# Patient Record
Sex: Male | Born: 1955 | Race: White | Hispanic: No | Marital: Married | Smoking: Current every day smoker
Health system: Southern US, Community
[De-identification: ages and names within clinical notes are randomized; demographics above are authoritative.]

## PROBLEM LIST (undated history)

## (undated) DIAGNOSIS — R079 Chest pain, unspecified: Secondary | ICD-10-CM

## (undated) DIAGNOSIS — N400 Enlarged prostate without lower urinary tract symptoms: Secondary | ICD-10-CM

## (undated) DIAGNOSIS — E785 Hyperlipidemia, unspecified: Secondary | ICD-10-CM

## (undated) DIAGNOSIS — N433 Hydrocele, unspecified: Secondary | ICD-10-CM

## (undated) DIAGNOSIS — I10 Essential (primary) hypertension: Secondary | ICD-10-CM

## (undated) HISTORY — DX: Essential (primary) hypertension: I10

## (undated) HISTORY — DX: Hyperlipidemia, unspecified: E78.5

## (undated) HISTORY — DX: Chest pain, unspecified: R07.9

## (undated) HISTORY — DX: Hydrocele, unspecified: N43.3

## (undated) HISTORY — DX: Benign prostatic hyperplasia without lower urinary tract symptoms: N40.0

---

## 1978-01-26 HISTORY — PX: RHINOPLASTY: SUR1284

## 2000-03-26 ENCOUNTER — Ambulatory Visit (HOSPITAL_COMMUNITY): Admission: RE | Admit: 2000-03-26 | Discharge: 2000-03-26 | Payer: Self-pay | Admitting: Gastroenterology

## 2010-05-22 ENCOUNTER — Emergency Department (HOSPITAL_COMMUNITY): Payer: Worker's Compensation

## 2010-05-22 ENCOUNTER — Emergency Department (HOSPITAL_COMMUNITY)
Admission: EM | Admit: 2010-05-22 | Discharge: 2010-05-22 | Disposition: A | Discharge status: Home / Self Care | Payer: Worker's Compensation | Attending: Emergency Medicine | Admitting: Emergency Medicine

## 2010-05-22 DIAGNOSIS — E7800 Pure hypercholesterolemia, unspecified: Secondary | ICD-10-CM | POA: Insufficient documentation

## 2010-05-22 DIAGNOSIS — T675XXA Heat exhaustion, unspecified, initial encounter: Secondary | ICD-10-CM | POA: Insufficient documentation

## 2010-05-22 DIAGNOSIS — X30XXXA Exposure to excessive natural heat, initial encounter: Secondary | ICD-10-CM | POA: Insufficient documentation

## 2010-05-22 DIAGNOSIS — R05 Cough: Secondary | ICD-10-CM | POA: Insufficient documentation

## 2010-05-22 DIAGNOSIS — R059 Cough, unspecified: Secondary | ICD-10-CM | POA: Insufficient documentation

## 2010-05-22 DIAGNOSIS — R61 Generalized hyperhidrosis: Secondary | ICD-10-CM | POA: Insufficient documentation

## 2010-05-22 DIAGNOSIS — R4182 Altered mental status, unspecified: Secondary | ICD-10-CM | POA: Insufficient documentation

## 2010-05-22 LAB — CBC
HCT: 40.4 % (ref 39.0–52.0)
MCH: 32.3 pg (ref 26.0–34.0)
MCHC: 35.6 g/dL (ref 30.0–36.0)
MCV: 90.6 fL (ref 78.0–100.0)
Platelets: 427 10*3/uL — ABNORMAL HIGH (ref 150–400)
RDW: 15.5 % (ref 11.5–15.5)

## 2010-05-22 LAB — BASIC METABOLIC PANEL
BUN: 10 mg/dL (ref 6–23)
CO2: 25 mEq/L (ref 19–32)
Chloride: 108 mEq/L (ref 96–112)
GFR calc non Af Amer: >60 mL/min (ref >60)
Glucose, Bld: 97 mg/dL (ref 70–99)
Potassium: 3.9 mEq/L (ref 3.5–5.1)
Sodium: 138 mEq/L (ref 135–145)

## 2010-05-22 LAB — DIFFERENTIAL
Eosinophils Absolute: 0 10*3/uL (ref 0.0–0.7)
Eosinophils Relative: 0 % (ref 0–5)
Lymphocytes Relative: 12 % (ref 12–46)
Lymphs Abs: 1.3 10*3/uL (ref 0.7–4.0)
Monocytes Absolute: 0.8 10*3/uL (ref 0.1–1.0)

## 2010-05-22 LAB — GLUCOSE, CAPILLARY: Glucose-Capillary: 117 mg/dL — ABNORMAL HIGH (ref 70–99)

## 2014-02-26 ENCOUNTER — Telehealth: Payer: Self-pay | Admitting: *Deleted

## 2014-02-26 NOTE — Telephone Encounter (Signed)
Patient called asking what type of vitamins he can take, a multivitamin should be okay and if he needs to take any others he can discuss with Dr. Lisbeth Renshaw on Wednesday after his treatment,patient siad "ok, with taking chemotherapy I want to keep my immune sytem up since I don't each much anyway" 11:29 AM

## 2014-09-17 ENCOUNTER — Other Ambulatory Visit: Payer: Self-pay | Admitting: Internal Medicine

## 2014-09-17 DIAGNOSIS — R519 Headache, unspecified: Secondary | ICD-10-CM

## 2014-09-17 DIAGNOSIS — R51 Headache: Principal | ICD-10-CM

## 2014-09-18 ENCOUNTER — Ambulatory Visit
Admission: RE | Admit: 2014-09-18 | Discharge: 2014-09-18 | Disposition: A | Discharge status: Home / Self Care | Payer: 59 | Source: Ambulatory Visit | Attending: Internal Medicine | Admitting: Internal Medicine

## 2014-09-18 DIAGNOSIS — R51 Headache: Principal | ICD-10-CM

## 2014-09-18 DIAGNOSIS — R519 Headache, unspecified: Secondary | ICD-10-CM

## 2014-09-21 ENCOUNTER — Other Ambulatory Visit: Payer: Worker's Compensation

## 2016-02-19 DIAGNOSIS — E782 Mixed hyperlipidemia: Secondary | ICD-10-CM | POA: Diagnosis not present

## 2016-02-19 DIAGNOSIS — Z0000 Encounter for general adult medical examination without abnormal findings: Secondary | ICD-10-CM | POA: Diagnosis not present

## 2016-10-01 DIAGNOSIS — R51 Headache: Secondary | ICD-10-CM | POA: Diagnosis not present

## 2016-10-01 DIAGNOSIS — R040 Epistaxis: Secondary | ICD-10-CM | POA: Diagnosis not present

## 2016-10-14 DIAGNOSIS — J342 Deviated nasal septum: Secondary | ICD-10-CM | POA: Diagnosis not present

## 2016-10-14 DIAGNOSIS — R51 Headache: Secondary | ICD-10-CM | POA: Diagnosis not present

## 2016-10-14 DIAGNOSIS — R040 Epistaxis: Secondary | ICD-10-CM | POA: Diagnosis not present

## 2016-10-14 DIAGNOSIS — H9313 Tinnitus, bilateral: Secondary | ICD-10-CM | POA: Diagnosis not present

## 2016-10-23 DIAGNOSIS — R51 Headache: Secondary | ICD-10-CM | POA: Diagnosis not present

## 2016-10-23 DIAGNOSIS — Z0000 Encounter for general adult medical examination without abnormal findings: Secondary | ICD-10-CM | POA: Diagnosis not present

## 2016-10-23 DIAGNOSIS — R739 Hyperglycemia, unspecified: Secondary | ICD-10-CM | POA: Diagnosis not present

## 2016-10-23 DIAGNOSIS — R030 Elevated blood-pressure reading, without diagnosis of hypertension: Secondary | ICD-10-CM | POA: Diagnosis not present

## 2016-10-23 DIAGNOSIS — E782 Mixed hyperlipidemia: Secondary | ICD-10-CM | POA: Diagnosis not present

## 2016-11-13 DIAGNOSIS — J342 Deviated nasal septum: Secondary | ICD-10-CM | POA: Diagnosis not present

## 2016-11-13 DIAGNOSIS — R51 Headache: Secondary | ICD-10-CM | POA: Diagnosis not present

## 2016-11-13 DIAGNOSIS — J321 Chronic frontal sinusitis: Secondary | ICD-10-CM | POA: Diagnosis not present

## 2016-11-13 DIAGNOSIS — J3489 Other specified disorders of nose and nasal sinuses: Secondary | ICD-10-CM | POA: Diagnosis not present

## 2016-12-03 DIAGNOSIS — R030 Elevated blood-pressure reading, without diagnosis of hypertension: Secondary | ICD-10-CM | POA: Diagnosis not present

## 2017-07-13 IMAGING — MR MR HEAD W/O CM
6 of 8 series · 30 of 48 positions shown · non-contrast
Comparison: Head CT without contrast 05/22/2010

CLINICAL DATA: 59-year-old male with 6 months of Severe headache
and pain behind the right eye with no known injury. Initial
encounter.

EXAM:
MRI HEAD WITHOUT CONTRAST
TECHNIQUE: Multiplanar, multiecho pulse sequences of the brain and surrounding
structures were obtained without intravenous contrast.

[Series 3: FLAIR · sagittal · 5.0mm · 0.47mm/px · 3 of 26 slices shown (1 of 2)]
[im 1/26]
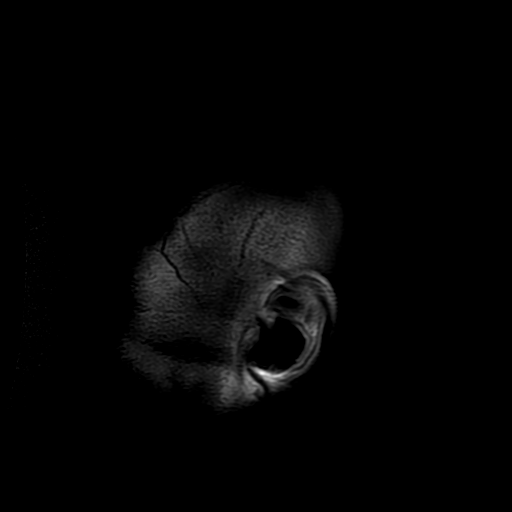
[im 13/26]
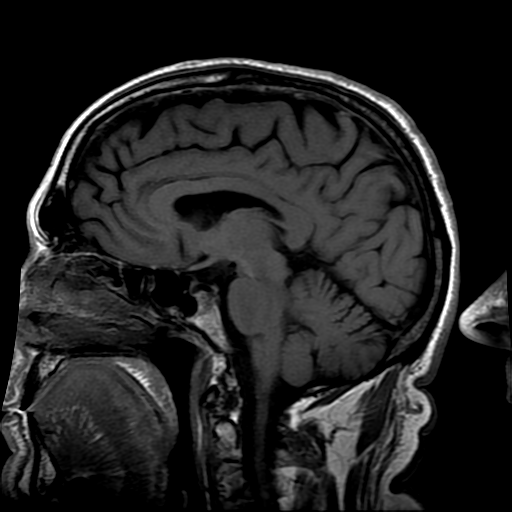
[im 26/26]
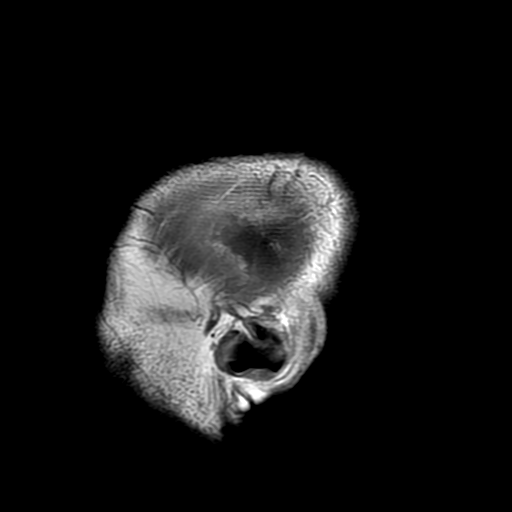

[Series 4: DWI · axial · 3.0mm · 1.09mm/px · z∈[-68,+98]mm · 9 of 114 slices shown (1 of 2)]
[im 1/114]
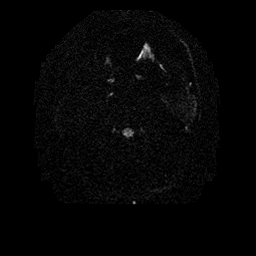
[im 17/114]
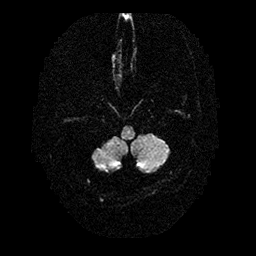
[im 33/114]
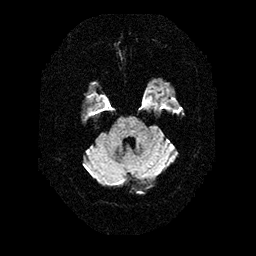
[im 49/114]
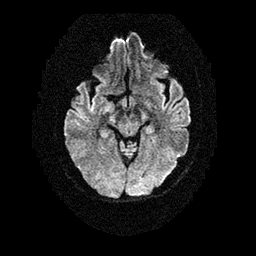
[im 57/114]
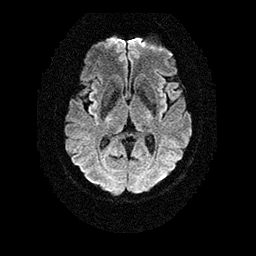
[im 65/114]
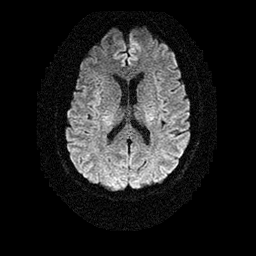
[im 81/114]
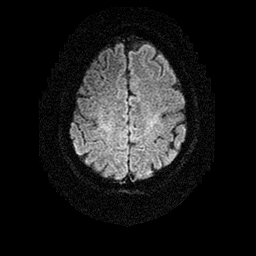
[im 97/114]
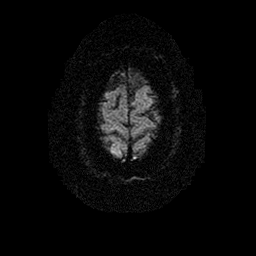
[im 114/114]
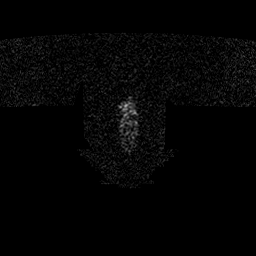

[Series 5: T2-star · axial · 5.0mm · 0.43mm/px · z∈[-57,+42]mm · 3 of 27 slices shown]
[im 1/27]
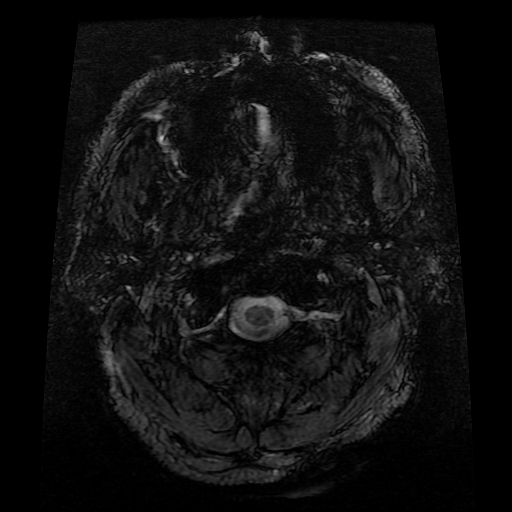
[im 9/27]
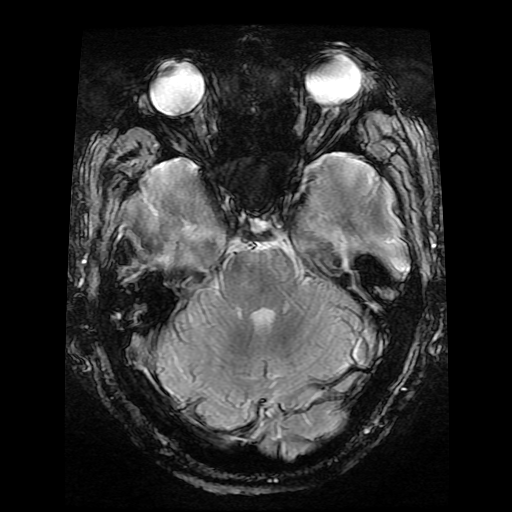
[im 18/27]
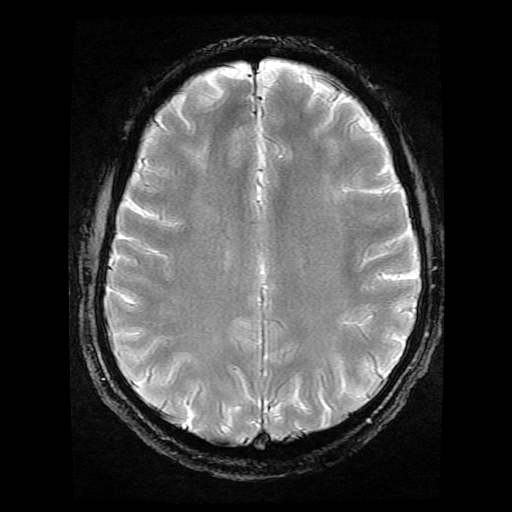

[Series 7: FLAIR · axial · 5.0mm · 0.43mm/px · z∈[-57,+95]mm · 4 of 27 slices shown (2 of 2)]
[im 1/27]
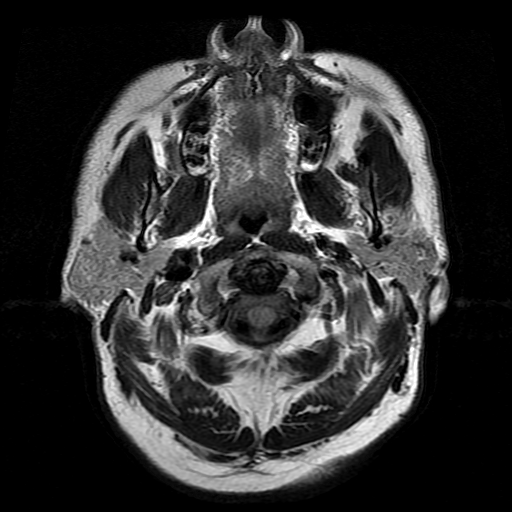
[im 9/27]
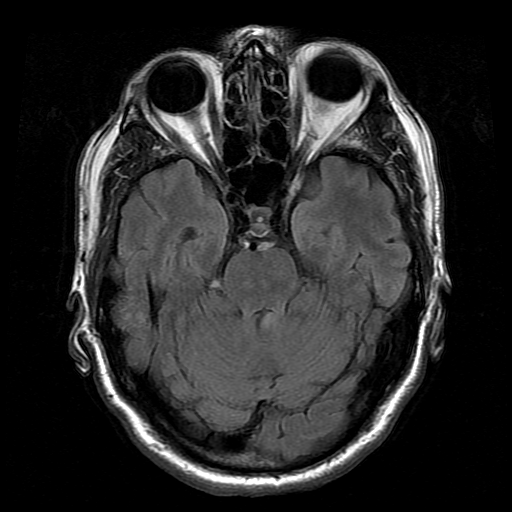
[im 18/27]
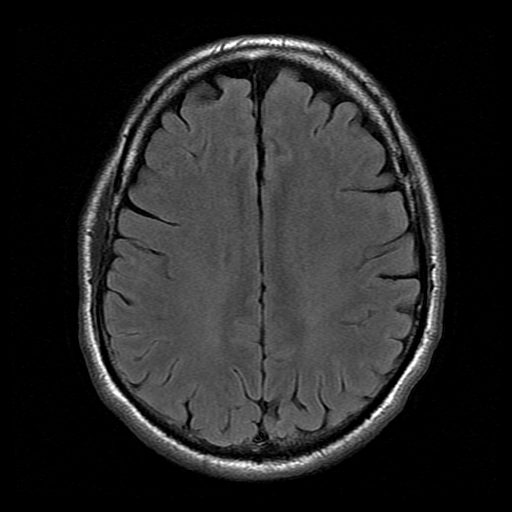
[im 27/27]
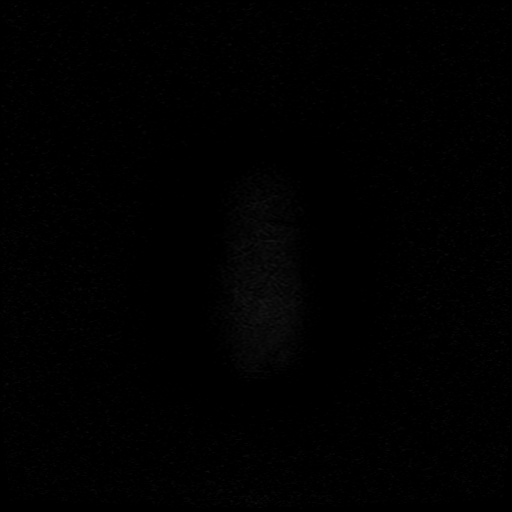

[Series 9: T2 · coronal · 5.0mm · 0.43mm/px · 4 of 31 slices shown]
[im 1/31]
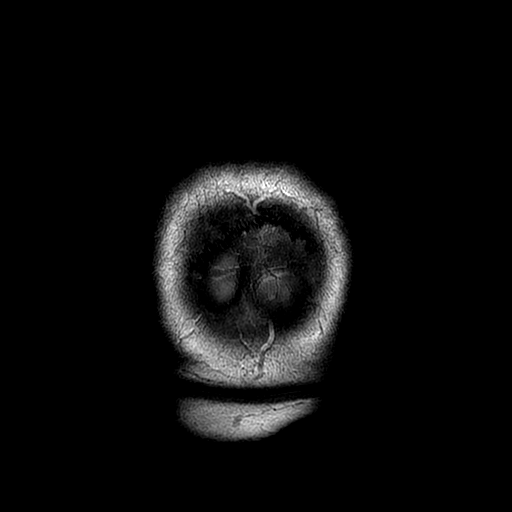
[im 11/31]
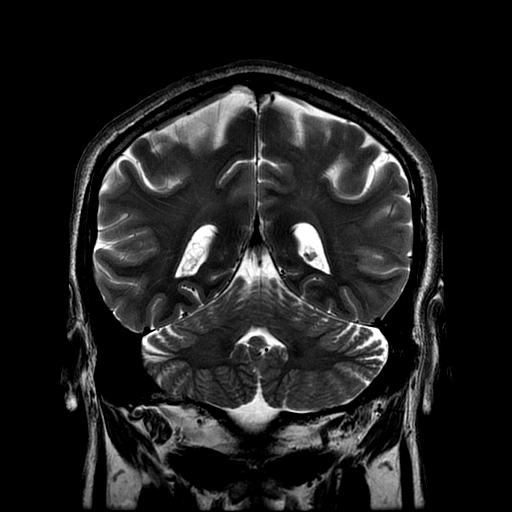
[im 21/31]
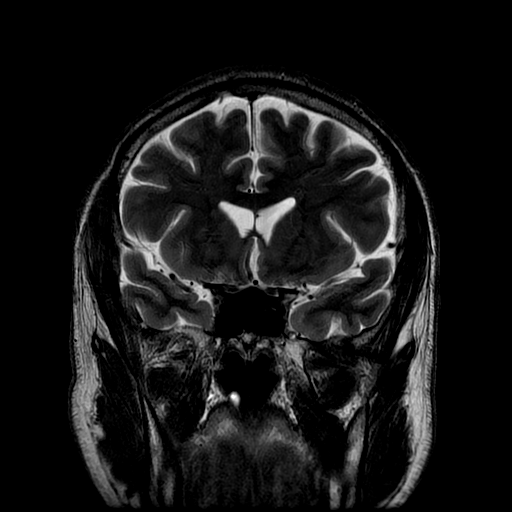
[im 31/31]
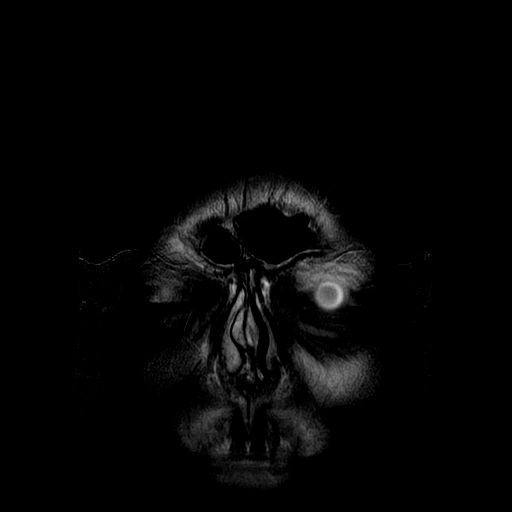

[Series 400: DWI · axial · 3.0mm · 1.09mm/px · z∈[-68,+98]mm · 7 of 57 slices shown (2 of 2)]
[im 1/57]
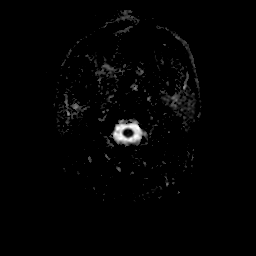
[im 10/57]
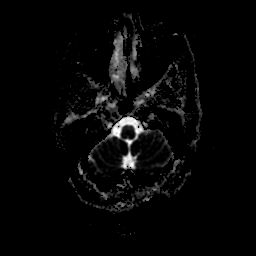
[im 19/57]
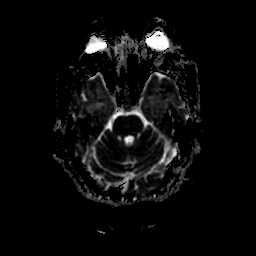
[im 29/57]
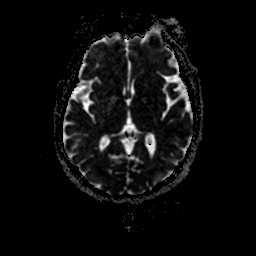
[im 38/57]
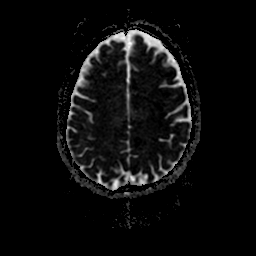
[im 47/57]
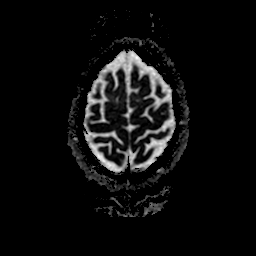
[im 57/57]
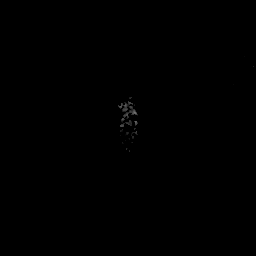

[30 of 48 positions shown; findings below may reference images not displayed]

FINDINGS: Cerebral volume remains within normal limits for age. No restricted
diffusion to suggest acute infarction. No midline shift, mass
effect, evidence of mass lesion, ventriculomegaly, extra-axial
collection or acute intracranial hemorrhage. Cervicomedullary
junction and pituitary are within normal limits. Major intracranial
vascular flow voids are within normal limits. Gray and white matter
signal is within normal limits throughout the brain. No
encephalomalacia or chronic cerebral blood products identified.

Orbits soft tissues appear normal. Paranasal sinuses and mastoids
are clear. Visible internal auditory structures appear normal.
Negative scalp soft tissues. Normal bone marrow signal. Negative
visualized cervical spine.
IMPRESSION: Normal noncontrast MRI appearance of the brain.

## 2017-11-04 DIAGNOSIS — Z0000 Encounter for general adult medical examination without abnormal findings: Secondary | ICD-10-CM | POA: Diagnosis not present

## 2017-11-04 DIAGNOSIS — E782 Mixed hyperlipidemia: Secondary | ICD-10-CM | POA: Diagnosis not present

## 2017-11-04 DIAGNOSIS — Z23 Encounter for immunization: Secondary | ICD-10-CM | POA: Diagnosis not present

## 2019-01-27 HISTORY — PX: COLONOSCOPY: SHX174

## 2021-01-26 HISTORY — PX: COLONOSCOPY: SHX174

## 2021-05-19 DIAGNOSIS — I10 Essential (primary) hypertension: Secondary | ICD-10-CM | POA: Diagnosis not present

## 2021-05-19 DIAGNOSIS — F17200 Nicotine dependence, unspecified, uncomplicated: Secondary | ICD-10-CM | POA: Diagnosis not present

## 2021-05-19 DIAGNOSIS — E782 Mixed hyperlipidemia: Secondary | ICD-10-CM | POA: Diagnosis not present

## 2021-05-27 DIAGNOSIS — D128 Benign neoplasm of rectum: Secondary | ICD-10-CM | POA: Diagnosis not present

## 2021-05-27 DIAGNOSIS — D122 Benign neoplasm of ascending colon: Secondary | ICD-10-CM | POA: Diagnosis not present

## 2021-05-27 DIAGNOSIS — D120 Benign neoplasm of cecum: Secondary | ICD-10-CM | POA: Diagnosis not present

## 2021-05-27 DIAGNOSIS — Z1211 Encounter for screening for malignant neoplasm of colon: Secondary | ICD-10-CM | POA: Diagnosis not present

## 2021-05-29 DIAGNOSIS — D122 Benign neoplasm of ascending colon: Secondary | ICD-10-CM | POA: Diagnosis not present

## 2021-12-16 ENCOUNTER — Other Ambulatory Visit: Payer: Self-pay

## 2021-12-16 ENCOUNTER — Emergency Department (HOSPITAL_COMMUNITY)
Admission: EM | Admit: 2021-12-16 | Discharge: 2021-12-17 | Discharge status: Against Medical Advice | Payer: Medicare Other | Attending: Emergency Medicine | Admitting: Emergency Medicine

## 2021-12-16 ENCOUNTER — Encounter (HOSPITAL_COMMUNITY): Payer: Self-pay

## 2021-12-16 DIAGNOSIS — Z5321 Procedure and treatment not carried out due to patient leaving prior to being seen by health care provider: Secondary | ICD-10-CM | POA: Insufficient documentation

## 2021-12-16 DIAGNOSIS — R634 Abnormal weight loss: Secondary | ICD-10-CM | POA: Diagnosis not present

## 2021-12-16 DIAGNOSIS — H547 Unspecified visual loss: Secondary | ICD-10-CM | POA: Diagnosis not present

## 2021-12-16 DIAGNOSIS — R000 Tachycardia, unspecified: Secondary | ICD-10-CM | POA: Diagnosis not present

## 2021-12-16 DIAGNOSIS — I10 Essential (primary) hypertension: Secondary | ICD-10-CM | POA: Diagnosis not present

## 2021-12-16 DIAGNOSIS — Z91141 Patient's other noncompliance with medication regimen due to financial hardship: Secondary | ICD-10-CM | POA: Diagnosis not present

## 2021-12-16 LAB — CBC
HCT: 53.2 % — ABNORMAL HIGH (ref 39.0–52.0)
Hemoglobin: 18.2 g/dL — ABNORMAL HIGH (ref 13.0–17.0)
MCH: 31.7 pg (ref 26.0–34.0)
MCHC: 34.2 g/dL (ref 30.0–36.0)
MCV: 92.5 fL (ref 80.0–100.0)
Platelets: 265 10*3/uL (ref 150–400)
RBC: 5.75 MIL/uL (ref 4.22–5.81)
RDW: 13.4 % (ref 11.5–15.5)
WBC: 12 10*3/uL — ABNORMAL HIGH (ref 4.0–10.5)
nRBC: 0 % (ref 0.0–0.2)

## 2021-12-16 LAB — COMPREHENSIVE METABOLIC PANEL
ALT: 22 U/L (ref 0–44)
AST: 25 U/L (ref 15–41)
Albumin: 5.1 g/dL — ABNORMAL HIGH (ref 3.5–5.0)
Alkaline Phosphatase: 102 U/L (ref 38–126)
Anion gap: 8 (ref 5–15)
BUN: 20 mg/dL (ref 8–23)
CO2: 26 mmol/L (ref 22–32)
Calcium: 10.5 mg/dL — ABNORMAL HIGH (ref 8.9–10.3)
Chloride: 108 mmol/L (ref 98–111)
Creatinine, Ser: 0.96 mg/dL (ref 0.61–1.24)
GFR, Estimated: >60 mL/min (ref >60)
Glucose, Bld: 110 mg/dL — ABNORMAL HIGH (ref 70–99)
Potassium: 4.7 mmol/L (ref 3.5–5.1)
Sodium: 142 mmol/L (ref 135–145)
Total Bilirubin: 1 mg/dL (ref 0.3–1.2)
Total Protein: 8.3 g/dL — ABNORMAL HIGH (ref 6.5–8.1)

## 2021-12-16 NOTE — ED Triage Notes (Signed)
Arrives EMS from PCP with c/o 180/103. Has lost 100lbs in ~6 months. Pt thinks he is being poisoned.
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
## 2021-12-18 ENCOUNTER — Emergency Department (HOSPITAL_COMMUNITY)
Admission: EM | Admit: 2021-12-18 | Discharge: 2021-12-20 | Disposition: A | Discharge status: Home / Self Care | Payer: Medicare Other | Attending: Emergency Medicine | Admitting: Emergency Medicine

## 2021-12-18 ENCOUNTER — Emergency Department (HOSPITAL_COMMUNITY): Payer: Medicare Other

## 2021-12-18 ENCOUNTER — Other Ambulatory Visit: Payer: Self-pay

## 2021-12-18 ENCOUNTER — Ambulatory Visit (HOSPITAL_COMMUNITY)
Admission: EM | Admit: 2021-12-18 | Discharge: 2021-12-18 | Disposition: A | Discharge status: Other Acute Inpatient Hospital | Payer: Medicare Other

## 2021-12-18 DIAGNOSIS — R4189 Other symptoms and signs involving cognitive functions and awareness: Secondary | ICD-10-CM | POA: Insufficient documentation

## 2021-12-18 DIAGNOSIS — R4182 Altered mental status, unspecified: Secondary | ICD-10-CM | POA: Insufficient documentation

## 2021-12-18 DIAGNOSIS — F1290 Cannabis use, unspecified, uncomplicated: Secondary | ICD-10-CM

## 2021-12-18 DIAGNOSIS — R7989 Other specified abnormal findings of blood chemistry: Secondary | ICD-10-CM | POA: Insufficient documentation

## 2021-12-18 DIAGNOSIS — I10 Essential (primary) hypertension: Secondary | ICD-10-CM | POA: Insufficient documentation

## 2021-12-18 DIAGNOSIS — F22 Delusional disorders: Secondary | ICD-10-CM

## 2021-12-18 DIAGNOSIS — R462 Strange and inexplicable behavior: Secondary | ICD-10-CM | POA: Insufficient documentation

## 2021-12-18 DIAGNOSIS — R461 Bizarre personal appearance: Secondary | ICD-10-CM | POA: Diagnosis present

## 2021-12-18 DIAGNOSIS — N179 Acute kidney failure, unspecified: Secondary | ICD-10-CM

## 2021-12-18 LAB — COMPREHENSIVE METABOLIC PANEL
ALT: 25 U/L (ref 0–44)
AST: 31 U/L (ref 15–41)
Albumin: 4.3 g/dL (ref 3.5–5.0)
Alkaline Phosphatase: 87 U/L (ref 38–126)
Anion gap: 11 (ref 5–15)
BUN: 22 mg/dL (ref 8–23)
CO2: 27 mmol/L (ref 22–32)
Calcium: 10.3 mg/dL (ref 8.9–10.3)
Chloride: 102 mmol/L (ref 98–111)
Creatinine, Ser: 1.35 mg/dL — ABNORMAL HIGH (ref 0.61–1.24)
GFR, Estimated: 58 mL/min — ABNORMAL LOW (ref >60)
Glucose, Bld: 135 mg/dL — ABNORMAL HIGH (ref 70–99)
Potassium: 3.7 mmol/L (ref 3.5–5.1)
Sodium: 140 mmol/L (ref 135–145)
Total Bilirubin: 1 mg/dL (ref 0.3–1.2)
Total Protein: 7.1 g/dL (ref 6.5–8.1)

## 2021-12-18 LAB — CBC WITH DIFFERENTIAL/PLATELET
Abs Immature Granulocytes: 0.01 10*3/uL (ref 0.00–0.07)
Basophils Absolute: 0 10*3/uL (ref 0.0–0.1)
Basophils Relative: 1 %
Eosinophils Absolute: 0 10*3/uL (ref 0.0–0.5)
Eosinophils Relative: 0 %
HCT: 50.6 % (ref 39.0–52.0)
Hemoglobin: 17.4 g/dL — ABNORMAL HIGH (ref 13.0–17.0)
Immature Granulocytes: 0 %
Lymphocytes Relative: 21 %
Lymphs Abs: 1.6 10*3/uL (ref 0.7–4.0)
MCH: 32 pg (ref 26.0–34.0)
MCHC: 34.4 g/dL (ref 30.0–36.0)
MCV: 93.2 fL (ref 80.0–100.0)
Monocytes Absolute: 0.6 10*3/uL (ref 0.1–1.0)
Monocytes Relative: 8 %
Neutro Abs: 5.6 10*3/uL (ref 1.7–7.7)
Neutrophils Relative %: 70 %
Platelets: 244 10*3/uL (ref 150–400)
RBC: 5.43 MIL/uL (ref 4.22–5.81)
RDW: 13.3 % (ref 11.5–15.5)
WBC: 7.9 10*3/uL (ref 4.0–10.5)
nRBC: 0 % (ref 0.0–0.2)

## 2021-12-18 LAB — URINALYSIS, ROUTINE W REFLEX MICROSCOPIC
Bilirubin Urine: NEGATIVE
Glucose, UA: NEGATIVE mg/dL
Ketones, ur: 20 mg/dL — AB
Leukocytes,Ua: NEGATIVE
Nitrite: NEGATIVE
Protein, ur: 30 mg/dL — AB
Specific Gravity, Urine: 1.033 — ABNORMAL HIGH (ref 1.005–1.030)
pH: 5 (ref 5.0–8.0)

## 2021-12-18 LAB — RAPID URINE DRUG SCREEN, HOSP PERFORMED
Amphetamines: NOT DETECTED
Barbiturates: NOT DETECTED
Benzodiazepines: NOT DETECTED
Cocaine: NOT DETECTED
Opiates: NOT DETECTED
Tetrahydrocannabinol: POSITIVE — AB

## 2021-12-18 LAB — ETHANOL: Alcohol, Ethyl (B): <10 mg/dL (ref <10)

## 2021-12-18 LAB — ACETAMINOPHEN LEVEL: Acetaminophen (Tylenol), Serum: <10 ug/mL — ABNORMAL LOW (ref 10–30)

## 2021-12-18 LAB — SALICYLATE LEVEL: Salicylate Lvl: <7 mg/dL — ABNORMAL LOW (ref 7.0–30.0)

## 2021-12-18 MED ORDER — LACTATED RINGERS IV BOLUS
1000.0000 mL | Freq: Once | INTRAVENOUS | Status: AC
  Administered 2021-12-18: 1000 mL via INTRAVENOUS
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
## 2021-12-18 NOTE — ED Triage Notes (Signed)
Pt is here with wife and son.  They report the pt has had auditory and visual hallucinations. Has been leaving the house barefoot and "walking the roads"  They report pt has expressed SI and HI to them.  Wife has expressed fear to be home with patient.  Pt himself denies SI/HI, any pain or hallucinations.  Pt states "this is all mind fuck to them cause they have been mind fucking me"

## 2021-12-18 NOTE — ED Notes (Signed)
TTS in process 
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
## 2021-12-18 NOTE — Progress Notes (Signed)
Paient presents to the [facility] with his son. Patient is "going through some hings and having a mental breakdown." Patient has a bizarre presentation and talking in circles to some degree. Patient states that he has not been sleeping or eating well. Patient has an attitude because his family forced him to come talk to someone. Patient appears to possibly have some early onset dementia. Thinks his son is his father. Patient denies SI/HI Psychosis. Patient admits to marijuana use. Patient is grandiose, states that, "I am a smart mother fucker." Patient is routine.

## 2021-12-18 NOTE — ED Provider Notes (Addendum)
Behavioral Health Urgent Care Medical Screening Exam  Patient Name: Jonathan Carson MRN: [id number] Date of Evaluation: 12/18/21 Chief Complaint: "flipping the fucking shit" Diagnosis:  Final diagnoses:  Disorganized thought process   History of Present illness: Jonathan Carson is a 66 y.o. male. Pt presents voluntarily to [facility] behavioral health for walk-in assessment.  Pt is accompanied by his son, Jonathan Carson. Pt is assessed face-to-face by nurse practitioner.   Jonathan Carson, 66 y.o., male patient seen face to face by this provider, patient consulted and seen with Dr. Lovette Cliche; and chart reviewed on 12/18/21.  On evaluation Jonathan Carson reports he is presenting today due to "flipping the fucking shit." Pt reports he hasn't eaten in 3 days. When asked reason for this, pt states that he is on a "journey and can't influence that". Pt reports that there is "evil" in his brother, father, son, and wife. He notes that there is also "good deep down". Pt reports he has been sleeping an hour or two a night, unable to state for how long this is occurring. He notes that despite poor sleep, he is "full of energy". He states this is likely because of "the lord's spirit and energy".   Pt denies SI/VI/HI, AVH. He is adamant that "I don't want to hurt myself or anyone else".   Pt reports history of 1 inpatient psychiatric admission at the age of 48 or 66 years old, where he was told he had a "chemical imbalance", and prescribed Mellaril. He states he has not taken any psychiatric medications since then. He denies history of suicide attempt or non suicidal self injurious behavior.   Pt endorses 30 year history of daily cigarette use. He endorses daily use of marijuana, although states he has not use in the past 2 or 3 days. Pt denies use of alcohol, crack/cocaine, or other substances.  Pt reports he owns firearms. His son, Jonathan Carson, currently is in possession of them.   Pt believes he has  prostate cancer. He states he has believed this since his [place] visit on 12/16/21 due to labwork that was completed. He states that he has "lumps" on his chest. Pt believes he has 1 year left to live.  Collateral with pt's son Jonathan Carson. Per Jonathan Carson, pt has been decompensating since February, possible earlier than that. Jonathan Carson states he does not believe pt has prostate cancer. He reports pt has had an 80 lb weight loss since February of this year. He states pt has been smoking marijuana for 30 years, stopped for 12 years, and restarted in February of this year after pt retired from work. Per Jonathan Carson, pt is isolating himself in the basement, painting the basement. Jonathan Carson states pt has kicked his wife out of the house, closed his banks accounts. Family found $20,000 on pt's table. Per Jonathan Carson, pt has at one point told him that he believes Jonathan Carson is his father. Jonathan Carson states pt has been texting his deceased friend. Pt's mother is currently on hospice care and has been diagnosed with Alzheimers. Jonathan Carson states he believe pt may be dehydrated. Jonathan Carson confirms that he has secured firearms from pt. Jonathan Carson is unaware of any psychiatric history for pt. He states pt's sister and brother have been diagnosed with bipolar disorder.   Discussed with Jonathan Carson and pt recommendation for medical clearance at the ED. Report called to Dr. Truett Mainland at [facility]. Recommend labwork for medical clearance, Head CT, CXR, and TTS consult. Pt is voluntary at this time.  Flow[facility]  C-SSRS RISK CATEGORY No Risk      Psychiatric Specialty Exam  Presentation  General Appearance:Appropriate for Environment; Casual; Fairly Groomed  Eye Contact:Fair  Speech:Clear and Coherent; Pressured  Speech Volume:Normal  Handedness:Right   Mood and Affect  Mood: Euphoric  Affect: Other (comment) (animated)   Thought Process  Thought Processes: Disorganized  Descriptions of  Associations:Tangential  Orientation:Full (Time, Place and Person)  Thought Content:Delusions; Paranoid Ideation; Tangential   Duration of Psychotic Symptoms: Greater than six months  Hallucinations:None  Ideas of Reference:Paranoia; Delusions  Suicidal Thoughts:No  Homicidal Thoughts:No   Sensorium  Memory: Immediate Good  Judgment: Poor  Insight: Shallow   Executive Functions  Concentration: Poor  Attention Span: Poor  Recall: [employer] of Knowledge: Fair  Language: Fair   Psychomotor Activity  Psychomotor Activity: Normal   Assets  Assets: Armed forces logistics/support/administrative officer; Desire for Improvement; Financial Resources/Insurance; Housing; Intimacy; Resilience; Social Support   Sleep  Sleep: Poor  Number of hours: No data recorded  No data recorded  Physical Exam: Physical Exam Constitutional:      General: He is not in acute distress.    Appearance: Normal appearance. He is not ill-appearing, toxic-appearing or diaphoretic.  Eyes:     General: No scleral icterus. Cardiovascular:     Rate and Rhythm: Normal rate.  Pulmonary:     Effort: Pulmonary effort is normal. No respiratory distress.  Neurological:     Mental Status: He is alert and oriented to person, place, and time.  Psychiatric:        Attention and Perception: Perception normal.        Mood and Affect: Mood is elated.        Speech: Speech is rapid and pressured and tangential.        Behavior: Behavior is hyperactive. Behavior is cooperative.        Thought Content: Thought content is paranoid and delusional.    Review of Systems  Constitutional:  Negative for chills and fever.  Respiratory:  Negative for shortness of breath.   Cardiovascular:  Negative for chest pain and palpitations.  Gastrointestinal:  Negative for abdominal pain.  Neurological:  Negative for headaches.  Psychiatric/Behavioral:  The patient has insomnia.    Blood pressure (!) 140/90, pulse 99, temperature  98.3 F (36.8 C), resp. rate 20, SpO2 99 %. There is no height or weight on file to calculate BMI.  Musculoskeletal: Strength & Muscle Tone: within normal limits Gait & Station: normal Patient leans: N/A   [facility] Discharge Disposition for Follow up and Recommendations: Based on my evaluation the patient is to be transferred to [facility] Emergency Department, accepting physician is Dr. Truett Mainland. Have discussed with attending physician, Dr. Lovette Cliche, and recommending labwork for medical clearance, Head CT, CXR, and TTS consult.  Tharon Aquas, NP 12/18/2021, 3:15 PM
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
## 2021-12-18 NOTE — BH Assessment (Addendum)
Clinician messaged Fuller Canada, RN: "Hey. It's Trey with TTS. Is the pt able to engage in the assessment, if so the pt will need to be placed in a private room. Is the pt under IVC? Also is the pt medically cleared? Can you fax his IVC paperwork to [phone]."   Clinician waiting response.    Vertell Novak, [place], [facility], [address] Triage Specialist [phone]

## 2021-12-18 NOTE — ED Provider Notes (Signed)
[facility] EMERGENCY DEPARTMENT Provider Note   CSN: [id number] Arrival date & time: 12/18/21  1529     History  Chief Complaint  Patient presents with   Medical Clearance    Jonathan Carson is a 66 y.o. male.  Patient is a 66 year old male with a past medical history of hypertension and hyperlipidemia not currently taking home medications presenting to the emergency department with psychosis.  Patient is here with his wife and his son who states that since February the patient has been declining.  He states that his mother has dementia and is on hospice and since her diagnosis he has had increasing psychosis.  He states that he will frequently walk up and down the streets barefoot.  They state that he stays in his basement painting for prolonged periods of time.  They state that he often checks one of his dad friends on his phone.  They state that he has tried to make moves on his daughter-in-law which is out of character for him.  They state that he also took all of his money out of his bank account and had it lying out on a table at home.  His son states that he has told him that he "he is too much of a coward" to commit suicide.  His wife states that she feels unsafe living with him at home and that he kicked her out of the house today.  They state that they were initially seen at [facility] today and were transferred here for medical clearance.  The patient reports to me that he is here today because he is concerned that his wife is poisoning him.  He states that he "needs to be scanned".  He states that she also poisoned his dog who is now deceased and buried in their backyard under a large rock.  He states that "we can do go up the dog and test him to prove that we have been poisoned as well".  He denies any pain, nausea or vomiting and denies any physical complaints to me.  The history is provided by the patient, the spouse and a relative. The history is limited by the  condition of the patient (Psychosis).       Home Medications Prior to Admission medications   Not on File      Allergies    Patient has no known allergies.    Review of Systems   Review of Systems  Physical Exam Updated Vital Signs BP (!) 153/97   Pulse 63   Temp 98.1 F (36.7 C)   Resp 18   SpO2 100%  Physical Exam Vitals and nursing note reviewed.  Constitutional:      General: He is not in acute distress.    Appearance: Normal appearance.  HENT:     Head: Normocephalic and atraumatic.     Nose: Nose normal.     Mouth/Throat:     Mouth: Mucous membranes are moist.     Pharynx: Oropharynx is clear.  Eyes:     Extraocular Movements: Extraocular movements intact.     Conjunctiva/sclera: Conjunctivae normal.     Pupils: Pupils are equal, round, and reactive to light.  Cardiovascular:     Rate and Rhythm: Normal rate and regular rhythm.     Pulses: Normal pulses.     Heart sounds: Normal heart sounds.  Pulmonary:     Effort: Pulmonary effort is normal.     Breath sounds: Normal breath sounds.  Abdominal:     General: Abdomen is flat.     Palpations: Abdomen is soft.     Tenderness: There is no abdominal tenderness.  Musculoskeletal:        General: Normal range of motion.     Cervical back: Normal range of motion and neck supple.     Right lower leg: No edema.     Left lower leg: No edema.  Skin:    General: Skin is warm and dry.  Neurological:     General: No focal deficit present.     Mental Status: He is alert and oriented to person, place, and time.     Sensory: No sensory deficit.     Motor: No weakness.  Psychiatric:     Comments: Calm and cooperative Paranoid behavior     ED Results / Procedures / Treatments   Labs (all labs ordered are listed, but only abnormal results are displayed) Labs Reviewed  COMPREHENSIVE METABOLIC PANEL - Abnormal; Notable for the following components:      Result Value   Glucose, Bld 135 (*)    Creatinine, Ser  1.35 (*)    GFR, Estimated 58 (*)    All other components within normal limits  RAPID URINE DRUG SCREEN, HOSP PERFORMED - Abnormal; Notable for the following components:   Tetrahydrocannabinol POSITIVE (*)    All other components within normal limits  CBC WITH DIFFERENTIAL/PLATELET - Abnormal; Notable for the following components:   Hemoglobin 17.4 (*)    All other components within normal limits  SALICYLATE LEVEL - Abnormal; Notable for the following components:   Salicylate Lvl <3.7 (*)    All other components within normal limits  ACETAMINOPHEN LEVEL - Abnormal; Notable for the following components:   Acetaminophen (Tylenol), Serum <10 (*)    All other components within normal limits  URINALYSIS, ROUTINE W REFLEX MICROSCOPIC - Abnormal; Notable for the following components:   APPearance HAZY (*)    Specific Gravity, Urine 1.033 (*)    Hgb urine dipstick SMALL (*)    Ketones, ur 20 (*)    Protein, ur 30 (*)    Bacteria, UA FEW (*)    All other components within normal limits  ETHANOL    EKG EKG Interpretation  Date/Time:  Thursday December 18 2021 18:43:16 EST Ventricular Rate:  76 PR Interval:  156 QRS Duration: 73 QT Interval:  368 QTC Calculation: 414 R Axis:   43 Text Interpretation: Sinus rhythm PVCs now resolved compared to prior EKG Confirmed by Leanord Asal ([id number]) on 12/18/2021 6:47:46 PM  Radiology CT Head Wo Contrast  Result Date: 12/18/2021 CLINICAL DATA:  Status change.  Unknown cause. EXAM: CT HEAD WITHOUT CONTRAST TECHNIQUE: Contiguous axial images were obtained from the base of the skull through the vertex without intravenous contrast. RADIATION DOSE REDUCTION: This exam was performed according to the departmental dose-optimization program which includes automated exposure control, adjustment of the mA and/or kV according to patient size and/or use of iterative reconstruction technique. COMPARISON:  MRI examination dated September 18, 2014. FINDINGS:  Brain: No evidence of acute infarction, hemorrhage, hydrocephalus, extra-axial collection or mass lesion/mass effect. Vascular: No hyperdense vessel or unexpected calcification. Skull: Normal. Negative for fracture or focal lesion. Sinuses/Orbits: No acute finding. Other: None. IMPRESSION: No acute intracranial pathology. Electronically Signed   By: Keane Police D.O.   On: 12/18/2021 17:55    Procedures Procedures    Medications Ordered in ED Medications  lactated ringers bolus 1,000 mL (has no administration  in time range)    ED Course/ Medical Decision Making/ A&P Clinical Course as of 12/18/21 1848  Thu Dec 18, 2021  1825 Mild ketones in the urine and slight increase in Cr 1.3 from 0.9. Wife reports patient has not been eating and will he will be hydrated with IVF pending remainder of his labs. [VK]  1826 CTH without acute findings [VK]  1830 Patient is medically cleared for psychiatry evaluation. [VK]    Clinical Course User Index [VK] Kemper Durie, DO                           Medical Decision Making This patient presents to the ED with chief complaint(s) of psychosis with pertinent past medical history of hypertension, hyperlipidemia not on medications which further complicates the presenting complaint. The complaint involves an extensive differential diagnosis and also carries with it a high risk of complications and morbidity.    The differential diagnosis includes ICH, mass effect, electrolyte abnormality, drug-induced psychosis, psychiatric illness induced psychosis  Additional history obtained: Additional history obtained from family Records reviewed behavioral health records from evaluation today  ED Course and Reassessment: On patient's arrival to the emergency department he is calm and cooperative but is paranoid on exam.  He has no physical complaints and no external signs of trauma.  He will undergo a medical clearance with labs, urine and a CT head.  He will  have an involuntary commitment placed by myself due to his psychosis and concern for safety for himself and others.  Once he is medically cleared he will require psychiatry evaluation.  Independent labs interpretation:  The following labs were independently interpreted: Mild AKI with creatinine of 1.3 from baseline of 0.9 with some ketones in the urine concerning for mild dehydration.  UDS positive for THC, labs otherwise within normal range  Independent visualization of imaging: - I independently visualized the following imaging with scope of interpretation limited to determining acute life threatening conditions related to emergency care: CT head, which revealed acute disease  Consultation: - Consulted or discussed management/test interpretation w/ external professional: TTS  Consideration for admission or further workup: Patient requires further evaluation by psychiatry to determine his disposition    Amount and/or Complexity of Data Reviewed Labs: ordered. Radiology: ordered.           Final Clinical Impression(s) / ED Diagnoses Final diagnoses:  Paranoia (psychosis) ([place])  AKI (acute kidney injury) ([place])  Marijuana use    Rx / [place] Orders ED Discharge Orders     None         Kemper Durie, DO 12/18/21 1848

## 2021-12-18 NOTE — ED Notes (Addendum)
Pt is not taking his blood pressure medication. Pt stated he has not been taking his blood pressure medication because he has a free will and doesn't not want to take it. Pt has not slept in a couple days and has had a decreased in appetite.

## 2021-12-19 DIAGNOSIS — R462 Strange and inexplicable behavior: Secondary | ICD-10-CM | POA: Insufficient documentation

## 2021-12-19 DIAGNOSIS — R4189 Other symptoms and signs involving cognitive functions and awareness: Secondary | ICD-10-CM | POA: Insufficient documentation

## 2021-12-19 MED ORDER — OLANZAPINE 5 MG PO TABS
5.0000 mg | ORAL_TABLET | Freq: Every day | ORAL | Status: DC
  Administered 2021-12-19: 5 mg via ORAL
  Filled 2021-12-19: qty 1

## 2021-12-19 NOTE — Progress Notes (Signed)
Inpatient Behavioral Health Placement   Pt meets inpatient criteria per Evette Georges, NP. There are no available beds at [facility] per [facility]. Referral was sent to the following facilities;   Destination  Service Provider Address Phone [facility]  [address] [place] [address] [place]  [facility]  [address] [place] [address] [phone] [facility]  [address] [place] [address] [phone] [place]  [place], [place] [address] [phone]  [facility]  [address] [place] [address] [place]  [facility]  [address] [place] [address] [phone]  [facility]  [address], [place] [address] [phone]  [place] [address], [place] [address] [phone]  [facility]  [address] [place] [address] [phone]  [place]  [address] [place] [address] [phone] [place]  [address] [place] [address] [phone] [place]  [place] [place] [address] [phone] [facility]  [address] [place] [address] [phone]  [facility]  [address] [place] [address] [place]  [facility]  [address], [place] [address] [phone] [facility]  [place], [place] [address] [phone]    Situation ongoing,  CSW will follow up.   Benjaman Kindler, MSW, [facility] 12/19/2021  @ 10:47 AM

## 2021-12-19 NOTE — ED Notes (Signed)
Alert, NAD, calm, interactive, resps e/u, pleasant, amiable, asking about "leaving timeframe, so he can plan".

## 2021-12-19 NOTE — ED Notes (Signed)
Message left with [employer] to transport patient. Awaiting return phone call.

## 2021-12-19 NOTE — ED Notes (Signed)
EDP by to see

## 2021-12-19 NOTE — ED Notes (Signed)
Asking about taking leads off chest in order to get ready to go (he is not being discharged, he is recommended for inpt psych).

## 2021-12-19 NOTE — Progress Notes (Addendum)
Pt was accepted to [facility] 12/19/21; [facility]  Pt meets inpatient criteria per Ricky Ala, NP  Attending Physician will be Philipp Ovens  Report can be called to: - [phone]  Pt can arrive: BED IS READY  Care Team notified: Ricky Ala, NP, and Abran Richard, RN  Nadara Mode, [place] 12/19/2021 @ 4:51 PM

## 2021-12-19 NOTE — ED Notes (Signed)
[facility] for report. Unable to give report at this time. Per facility transport needs to be in the department to give report.

## 2021-12-19 NOTE — Consult Note (Signed)
[place] Psychiatry Consult   Reason for Consult:  Disorganized thoughts Referring Physician:  Emergency Department Provider  Patient Identification: Jonathan Carson MRN:  [id number] Principal Diagnosis: Bizarre behavior Diagnosis:  Principal Problem:   Bizarre behavior Active Problems:   Disorganized thought process   Total Time spent with patient: 15 minutes  Subjective:   JEHAD Carson is a 66 y.o. male seen and evaluated face-to-face by this provider.  Continues to deny suicidal or homicidal ideation. Patient appears hyper religious with ongoing ruminations. Stated that " God is in control, I am going put my wife out of the house, she better get right with God."  He is awake, alert and oriented to person and place.  Chart review patient has documented behaviors related to auditory and visual hallucinations.  Documented paranoid ideations.  Will continue to recommend inpatient admission once medically cleared.  He provided verbal authorization to follow-up with his son who he reports is in control of his mental health and personal affairs.  Denied previous inpatient admissions.  Denies that he is followed by therapy or psychiatry currently. Patient was initiated on Zyprexa 5 mg nightly. Does admit to smoking marijuana occasionally.  Chart review patient has been accepted to [facility].     HPI:  Per admission assessment note for [facility] urgent [facility]- "Jonathan Carson reports he is presenting today due to "flipping the fucking shit." Pt reports he hasn't eaten in 3 days. When asked reason for this, pt states that he is on a "journey and can't influence that". Pt reports that there is "evil" in his brother, father, son, and wife. He notes that there is also "good deep down". Pt reports he has been sleeping an hour or two a night, unable to state for how long this is occurring. He notes that despite poor sleep, he is "full of energy". He states this is likely because of "the  lord's spirit and energy".   Past Psychiatric History:   Risk to Self:   Risk to Others:   Prior Inpatient Therapy:   Prior Outpatient Therapy:    Past Medical History: No past medical history on file. No past surgical history on file. Family History: No family history on file. Family Psychiatric  History:  Social History:  Social History   Substance and Sexual Activity  Alcohol Use Never     Social History   Substance and Sexual Activity  Drug Use Never    Social History   Socioeconomic History   Marital status: Married    Spouse name: Not on file   Number of children: Not on file   Years of education: Not on file   Highest education level: Not on file  Occupational History   Not on file  Tobacco Use   Smoking status: Never   Smokeless tobacco: Never  Substance and Sexual Activity   Alcohol use: Never   Drug use: Never   Sexual activity: Not on file  Other Topics Concern   Not on file  Social History Narrative   Not on file   Social Determinants of Health   Financial Resource Strain: Not on file  Food Insecurity: Not on file  Transportation Needs: Not on file  Physical Activity: Not on file  Stress: Not on file  Social Connections: Not on file   Additional Social History:    Allergies:  No Known Allergies  Labs:  Results for orders placed or performed during the hospital encounter of 12/18/21 (from the  past 48 hour(s))  Comprehensive metabolic panel     Status: Abnormal   Collection Time: 12/18/21  4:29 PM  Result Value Ref Range   Sodium 140 135 - 145 mmol/L   Potassium 3.7 3.5 - 5.1 mmol/L   Chloride 102 98 - 111 mmol/L   CO2 27 22 - 32 mmol/L   Glucose, Bld 135 (H) 70 - 99 mg/dL    Comment: Glucose reference range applies only to samples taken after fasting for at least 8 hours.   BUN 22 8 - 23 mg/dL   Creatinine, Ser 1.35 (H) 0.61 - 1.24 mg/dL   Calcium 10.3 8.9 - 10.3 mg/dL   Total Protein 7.1 6.5 - 8.1 g/dL   Albumin 4.3 3.5 - 5.0 g/dL    AST 31 15 - 41 U/L   ALT 25 0 - 44 U/L   Alkaline Phosphatase 87 38 - 126 U/L   Total Bilirubin 1.0 0.3 - 1.2 mg/dL   GFR, Estimated 58 (L) >60 mL/min    Comment: (NOTE) Calculated using the CKD-EPI Creatinine Equation (2021)    Anion gap 11 5 - 15    Comment: Performed at [facility] [address] [place], [place] [address]  Urine rapid drug screen (hosp performed)     Status: Abnormal   Collection Time: 12/18/21  4:29 PM  Result Value Ref Range   Opiates NONE DETECTED NONE DETECTED   Cocaine NONE DETECTED NONE DETECTED   Benzodiazepines NONE DETECTED NONE DETECTED   Amphetamines NONE DETECTED NONE DETECTED   Tetrahydrocannabinol POSITIVE (A) NONE DETECTED   Barbiturates NONE DETECTED NONE DETECTED    Comment: (NOTE) DRUG SCREEN FOR MEDICAL PURPOSES ONLY.  IF CONFIRMATION IS NEEDED FOR ANY PURPOSE, NOTIFY LAB WITHIN 5 DAYS.  LOWEST DETECTABLE LIMITS FOR URINE DRUG SCREEN Drug Class                     Cutoff (ng/mL) Amphetamine and metabolites    1000 Barbiturate and metabolites    200 Benzodiazepine                 200 Opiates and metabolites        300 Cocaine and metabolites        300 THC                            50 Performed at [facility], [place] [address] [place], [place] [address]   CBC with Diff     Status: Abnormal   Collection Time: 12/18/21  4:29 PM  Result Value Ref Range   WBC 7.9 4.0 - 10.5 K/uL   RBC 5.43 4.22 - 5.81 MIL/uL   Hemoglobin 17.4 (H) 13.0 - 17.0 g/dL   HCT 50.6 39.0 - 52.0 %   MCV 93.2 80.0 - 100.0 fL   MCH 32.0 26.0 - 34.0 pg   MCHC 34.4 30.0 - 36.0 g/dL   RDW 13.3 11.5 - 15.5 %   Platelets 244 150 - 400 K/uL   nRBC 0.0 0.0 - 0.2 %   Neutrophils Relative % 70 %   Neutro Abs 5.6 1.7 - 7.7 K/uL   Lymphocytes Relative 21 %   Lymphs Abs 1.6 0.7 - 4.0 K/uL   Monocytes Relative 8 %   Monocytes Absolute 0.6 0.1 - 1.0 K/uL   Eosinophils Relative 0 %   Eosinophils Absolute 0.0 0.0 - 0.5 K/uL   Basophils  Relative 1 %    Basophils Absolute 0.0 0.0 - 0.1 K/uL   Immature Granulocytes 0 %   Abs Immature Granulocytes 0.01 0.00 - 0.07 K/uL    Comment: Performed at [facility], [place] [address] [place], [place] [address]  Ethanol     Status: None   Collection Time: 12/18/21  4:31 PM  Result Value Ref Range   Alcohol, Ethyl (B) <10 <10 mg/dL    Comment: (NOTE) Lowest detectable limit for serum alcohol is 10 mg/dL.  For medical purposes only. Performed at [facility], [place] [address], [place], [place] [address]   Salicylate level     Status: Abnormal   Collection Time: 12/18/21  4:31 PM  Result Value Ref Range   Salicylate Lvl <4.6 (L) 7.0 - 30.0 mg/dL    Comment: Performed at [facility] [address] [place], [place] [address]  Acetaminophen level     Status: Abnormal   Collection Time: 12/18/21  4:31 PM  Result Value Ref Range   Acetaminophen (Tylenol), Serum <10 (L) 10 - 30 ug/mL    Comment: (NOTE) Therapeutic concentrations vary significantly. A range of 10-30 ug/mL  may be an effective concentration for many patients. However, some  are best treated at concentrations outside of this range. Acetaminophen concentrations >150 ug/mL at 4 hours after ingestion  and >50 ug/mL at 12 hours after ingestion are often associated with  toxic reactions.  Performed at [facility], [place] [address] [place], [place] [address]   Urinalysis, Routine w reflex microscopic Urine, Clean Catch     Status: Abnormal   Collection Time: 12/18/21  4:47 PM  Result Value Ref Range   Color, Urine YELLOW YELLOW   APPearance HAZY (A) CLEAR   Specific Gravity, Urine 1.033 (H) 1.005 - 1.030   pH 5.0 5.0 - 8.0   Glucose, UA NEGATIVE NEGATIVE mg/dL   Hgb urine dipstick SMALL (A) NEGATIVE   Bilirubin Urine NEGATIVE NEGATIVE   Ketones, ur 20 (A) NEGATIVE mg/dL   Protein, ur 30 (A) NEGATIVE mg/dL   Nitrite NEGATIVE NEGATIVE   Leukocytes,Ua NEGATIVE NEGATIVE   RBC / HPF 0-5 0 - 5 RBC/hpf   WBC,  UA 0-5 0 - 5 WBC/hpf   Bacteria, UA FEW (A) NONE SEEN   Mucus PRESENT    Hyaline Casts, UA PRESENT     Comment: Performed at [facility], [address], [place],  [address]    Current Facility-Administered Medications  Medication Dose Route Frequency Provider Last Rate Last Admin   OLANZapine (ZYPREXA) tablet 5 mg  5 mg Oral QHS Derrill [place], NP       No current outpatient medications on file.    Musculoskeletal: Strength & Muscle Tone: within normal limits Gait & Station: normal Patient leans: N/A            Psychiatric Specialty Exam:  Presentation  General Appearance:  Appropriate for Environment; Casual; Fairly Groomed  Eye Contact: Fair  Speech: Clear and Coherent; Pressured  Speech Volume: Normal  Handedness: Right   Mood and Affect  Mood: Euphoric  Affect: Other (comment) (animated)   Thought Process  Thought Processes: Disorganized  Descriptions of Associations:Tangential  Orientation:Full (Time, Place and Person)  Thought Content:Delusions; Paranoid Ideation; Tangential  History of Schizophrenia/Schizoaffective disorder:No  Duration of Psychotic Symptoms:Greater than six months  Hallucinations:Hallucinations: None  Ideas of Reference:Paranoia; Delusions  Suicidal Thoughts:Suicidal Thoughts: No  Homicidal Thoughts:Homicidal Thoughts: No  Sensorium  Memory: Immediate Good  Judgment: Poor  Insight: Shallow   Executive Functions  Concentration: Poor  Attention Span: Poor  Recall: [employer] of Knowledge: Fair  Language: Fair   Psychomotor Activity  Psychomotor Activity: Psychomotor Activity: Normal   Assets  Assets: Communication Skills; Desire for Improvement; Financial Resources/Insurance; Housing; Intimacy; Resilience; Social Support   Sleep  Sleep: Sleep: Poor   Physical Exam: Physical Exam Vitals and nursing note reviewed.  Cardiovascular:     Rate and Rhythm:  Normal rate and regular rhythm.  Neurological:     Mental Status: He is oriented to person, place, and time.  Psychiatric:        Mood and Affect: Mood normal.        Behavior: Behavior normal.    ROS Blood pressure (!) 153/78, pulse (!) 52, temperature 98.5 F (36.9 C), temperature source Oral, resp. rate 18, SpO2 97 %. There is no height or weight on file to calculate BMI.  Treatment Plan Summary: Daily contact with patient to assess and evaluate symptoms and progress in treatment and Medication management  Initiated Zyprexa 5 mg p.o. nightly  Disposition: Recommend psychiatric Inpatient admission when medically cleared.  Chart review patient accepted to [facility]  Derrill Center, NP 12/19/2021 5:44 PM

## 2021-12-19 NOTE — ED Provider Notes (Signed)
Emergency Medicine Observation Re-evaluation Note  Jonathan Carson is a 66 y.o. male, seen on rounds today.  Pt initially presented to the ED for complaints of Medical Clearance Currently, the patient is sleeping.  Physical Exam  BP (!) 159/114   Pulse 65   Temp 97.7 F (36.5 C) (Oral)   Resp 19   SpO2 98%  Physical Exam General: Calm Cardiac: Normal rate Lungs: No increased work of breathing Psych: Calm  ED Course / MDM  EKG:EKG Interpretation  Date/Time:  Thursday December 18 2021 18:43:16 EST Ventricular Rate:  76 PR Interval:  156 QRS Duration: 73 QT Interval:  368 QTC Calculation: 414 R Axis:   43 Text Interpretation: Sinus rhythm PVCs now resolved compared to prior EKG Confirmed by Leanord Asal ([id number]) on 12/18/2021 6:47:46 PM  I have reviewed the labs performed to date as well as medications administered while in observation.  Recent changes in the last 24 hours include none.  Plan  Current plan is for placement to a psychiatric facility.    Malvin Johns, MD 12/19/21 [phone]

## 2021-12-29 ENCOUNTER — Ambulatory Visit: Payer: Medicare Other | Admitting: Behavioral Health

## 2021-12-29 ENCOUNTER — Encounter: Payer: Self-pay | Admitting: Behavioral Health

## 2021-12-29 VITALS — BP 124/68 | HR 74 | Ht 70.0 in | Wt 161.0 lb

## 2021-12-29 DIAGNOSIS — R4189 Other symptoms and signs involving cognitive functions and awareness: Secondary | ICD-10-CM | POA: Diagnosis not present

## 2021-12-29 DIAGNOSIS — F22 Delusional disorders: Secondary | ICD-10-CM

## 2021-12-29 DIAGNOSIS — F411 Generalized anxiety disorder: Secondary | ICD-10-CM

## 2021-12-29 DIAGNOSIS — F23 Brief psychotic disorder: Secondary | ICD-10-CM | POA: Diagnosis not present

## 2021-12-29 DIAGNOSIS — F5105 Insomnia due to other mental disorder: Secondary | ICD-10-CM

## 2021-12-29 DIAGNOSIS — F99 Mental disorder, not otherwise specified: Secondary | ICD-10-CM

## 2021-12-29 MED ORDER — QUETIAPINE FUMARATE 25 MG PO TABS: 25.0000 mg | ORAL_TABLET | Freq: Every day | ORAL | 1 refills | Status: DC

## 2021-12-29 MED ORDER — ARIPIPRAZOLE 15 MG PO TABS: 15.0000 mg | ORAL_TABLET | Freq: Every day | ORAL | 1 refills | Status: DC

## 2021-12-29 NOTE — Progress Notes (Signed)
[facility] MD/PA/NP Initial Note  12/29/2021 12:27 PM KYNDALL CHAPLIN  MRN:  [id number]  Chief Complaint:  Chief Complaint   Paranoid; Patient Education; Altered Mental Status; Establish Care; Manic Behavior; Anxiety; Stress; Depression     HPI:   "Jonathan Carson", 66 year old male presents to this office for initial visit and to establish care.  His wife Judeen Hammans came with him to the appointment and is waiting on him in the lobby.  He exhibits rapid speech and some restlessness.  Sometimes he is difficult to redirect. His thoughts continue to be mildly disorganized.  Even though his thought process is tangential at times, collateral information should be considered reliable as it does correlate with recent medical records reviewed. He has a hard time staying seated often getting up and down from the chair. He says "I am having a lot of stress issues, but my mom and the ground on this past Friday, and a lot of marital problems and family problems".  He says that he checked himself into the [facility] ED on December 18, 2021.  Says that he ended up staying for couple days before being released.  Says that he started having a lot more stress in his life this past year, and that he just got to the place where "I just had enough".  He talks about a period where he did not trust his wife or any of his family, and wondered at one time if someone was putting something in his food because he lost nearly 80 pounds.  When asked about what he does for a living he said that he retired from his job with the city of [place] in January, 2023.  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  He says that his primary care physician knows that he is here for this visit and that he would like to establish care and have someone manage his medications.  Says that he does not like taking any medication but is willing to try.  He says that he understands that something  is not right but that long-term stress is the cause for his psychological break.  He says that even though he loved his mother, that some stress of having to help her all the time has been alleviated.  Says that he still has to deal with a poor relationship with his father.  Says that his blood pressure had previously been very high but his PCP started him on a new medication that has also helped him feel better.  His PHQ-9 was negative.  On MDQ he did endorse periods of hyperactivity, irritability, decreased need for sleep, racing thoughts, and irresponsibility with money. He endorses occasional use of Cannabis. He says that since being prescribed trazodone, that he is only getting 3 to 4 hours of sleep per night.  He said that this was not common before he started enduring lots of stress.  Says that he would like to be able to get more sleep.  Acknowledges 1 prior hospitalization for psychiatric issues when he was 7 or 20 but does not remember the details.  He denies any auditory or visual hallucinations.  Has family history of bipolar and dementia. He denies any history of SI or HI.  Says that he currently feels safe with no desire to harm himself.  Says that he has strong family support from his wife and a sister who is a Designer, jewellery.  Past  psychiatric medications: Patient denies     Visit Diagnosis:    ICD-10-CM   1. Brief psychotic disorder (HCC)  F23 ARIPiprazole (ABILIFY) 15 MG tablet    2. Generalized anxiety disorder  F41.1 ARIPiprazole (ABILIFY) 15 MG tablet    QUEtiapine (SEROQUEL) 25 MG tablet    3. Paranoia (psychosis) (HCC)  F22 ARIPiprazole (ABILIFY) 15 MG tablet    4. Disorganized thought process  R41.89 ARIPiprazole (ABILIFY) 15 MG tablet    5. Insomnia due to other mental disorder  F51.05 QUEtiapine (SEROQUEL) 25 MG tablet   F99       Past Psychiatric History: Disorganized Thoughts, Psychosis with paranoia, Stress, Insomnia, Generalized Anxiety, 2 prior  Hospitalizations.  Past Medical History: No past medical history on file. No past surgical history on file.  Family Psychiatric History: see chart  Family History:  Family History  Problem Relation Age of Onset   Dementia Mother     Social History:  Social History   Socioeconomic History   Marital status: Married    Spouse name: Judeen Hammans   Number of children: 1   Years of education: 12   Highest education level: GED or equivalent  Occupational History   Occupation: Retired Programme researcher, broadcasting/film/video  Tobacco Use   Smoking status: Every Day    Packs/day: 0.50    Years: 45.00    Total pack years: 22.50    Types: Cigarettes   Smokeless tobacco: Never  Substance and Sexual Activity   Alcohol use: Not Currently   Drug use: Not Currently    Types: Marijuana   Sexual activity: Not Currently  Other Topics Concern   Not on file  Social History Narrative   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   Social Determinants of Health   Financial Resource Strain: Not on file  Food Insecurity: Not on file  Transportation Needs: Not on file  Physical Activity: Not on file  Stress: Not on file  Social Connections: Not on file    Allergies: No Known Allergies  Metabolic Disorder Labs: No results found for: "HGBA1C", "MPG" No results found for: "PROLACTIN" No results found for: "CHOL", "TRIG", "HDL", "CHOLHDL", "VLDL", "LDLCALC" No results found for: "TSH"  Therapeutic Level Labs: No results found for: "LITHIUM" No results found for: "VALPROATE" No results found for: "CBMZ"  Current Medications: Current Outpatient Medications  Medication Sig Dispense Refill   ARIPiprazole (ABILIFY) 15 MG tablet Take 1 tablet (15 mg total) by mouth daily. 30 tablet 1   QUEtiapine (SEROQUEL) 25 MG tablet Take 1 tablet (25 mg total) by mouth at bedtime. 30 tablet 1   No current facility-administered medications for this visit.     Medication Side Effects: none  Orders placed this visit:  No orders of the defined types were placed in this encounter.   Psychiatric Specialty Exam:  Review of Systems  Constitutional: Negative.   Allergic/Immunologic: Negative.   Neurological: Negative.   Psychiatric/Behavioral:  Positive for dysphoric mood and sleep disturbance. The patient is nervous/anxious and is hyperactive.     Blood pressure 124/68, pulse 74, height '5\' 10"'$  (1.778 m), weight 161 lb (73 kg).Body mass index is 23.1 kg/m.  General Appearance: Casual and Neat  Eye Contact:  Good  Speech:  Pressured and Talkative  Volume:  Increased  Mood:  Anxious and Depressed  Affect:  Depressed and Anxious  Thought Process:  Disorganized and Descriptions of Associations: Tangential  Orientation:  Full (  Time, Place, and Person)  Thought Content: Paranoid Ideation and Tangential   Suicidal Thoughts:  No  Homicidal Thoughts:  No  Memory:  WNL  Judgement:  Fair  Insight:  Fair  Psychomotor Activity:  Increased and Restlessness  Concentration:  Concentration: Fair  Recall:  [place] of Knowledge: Fair  Language: Fair  Assets:  Desire for Improvement Physical Health Resilience Social Support  ADL's:  Intact  Cognition: WNL  Prognosis:  Good   Screenings:  [place] Office Visit from 12/29/2021 in [place] Total Score 0      PHQ2-9    [place] Office Visit from 12/29/2021 in [place]  PHQ-2 Total Score 1      [place] ED from 12/18/2021 in [place] ED from 12/16/2021 in [place] DEPT  C-SSRS RISK CATEGORY No Risk No Risk       Receiving Psychotherapy: No   Treatment Plan/Recommendations:   Greater than 50% of  60 min face to face time with patient was spent on counseling and coordination of care. We discussed his recent visit to ER, previous hx and current  treatment. He is following up with PCP regularly. We reviewed his medications and current hx. He reports some improvement since initializing new medication prescribed on discharge. He is still reporting not getting enough sleep. He is still presenting with criterion for mania and disorganized thoughts with paranoia. Further diagnosis in ongoing over the next few weeks until I can observe response to medication and conduct further assessments. He acknowledges family hx of bipolar and report of previous hospitalization at age of 73 or 46.  Today we agreed to; Will increase Abilify to 15 mg to be taken at bedtime daily Will continue trazodone 50 mg at bedtime To start Seroquel 25 mg at bedtime Will report worsening symptoms or side effects Provided emergency contact information To follow-up in 4 weeks to reassess Discussed potential metabolic side effects associated with atypical antipsychotics, as well as potential risk for movement side effects. Advised pt to contact office if movement side effects occur.   Reviewed with him the risk of continuing Cannabis use while taking psychiatric medications or complicating treatment Reviewed PDMP    Elwanda Brooklyn, NP

## 2021-12-30 ENCOUNTER — Telehealth: Payer: Self-pay | Admitting: Behavioral Health

## 2021-12-30 NOTE — Telephone Encounter (Signed)
Wife, Judeen Hammans called today at 11:00 needing a call back about Jonathan Carson's medications.  He doesn't remember what he is supposed to do.  He came home with some medication and other medication was called in. It has not been picked up yet.  She does his medication and she needs to know how to administer it and needs to know if he is also supposed to be taking the medication given to him at the hospital.  Please call her to review all the medications.  [phone].  There is a DPR on file.

## 2021-12-30 NOTE — Telephone Encounter (Signed)
Spoke to wife and gave her info

## 2022-01-28 ENCOUNTER — Ambulatory Visit (INDEPENDENT_AMBULATORY_CARE_PROVIDER_SITE_OTHER): Payer: Medicare Other | Admitting: Behavioral Health

## 2022-01-28 ENCOUNTER — Encounter: Payer: Self-pay | Admitting: Behavioral Health

## 2022-01-28 DIAGNOSIS — F5105 Insomnia due to other mental disorder: Secondary | ICD-10-CM

## 2022-01-28 DIAGNOSIS — F23 Brief psychotic disorder: Secondary | ICD-10-CM | POA: Diagnosis not present

## 2022-01-28 DIAGNOSIS — R4189 Other symptoms and signs involving cognitive functions and awareness: Secondary | ICD-10-CM

## 2022-01-28 DIAGNOSIS — F99 Mental disorder, not otherwise specified: Secondary | ICD-10-CM

## 2022-01-28 DIAGNOSIS — F411 Generalized anxiety disorder: Secondary | ICD-10-CM | POA: Diagnosis not present

## 2022-01-28 DIAGNOSIS — F22 Delusional disorders: Secondary | ICD-10-CM

## 2022-01-28 MED ORDER — ARIPIPRAZOLE 15 MG PO TABS: 15.0000 mg | ORAL_TABLET | Freq: Every day | ORAL | 1 refills | Status: DC

## 2022-01-28 MED ORDER — QUETIAPINE FUMARATE 25 MG PO TABS: 25.0000 mg | ORAL_TABLET | Freq: Every day | ORAL | 1 refills | Status: DC

## 2022-01-28 NOTE — Progress Notes (Signed)
[facility] Med Check  Patient ID: Jonathan Carson,  MRN: [id number]  PCP: [facility], [facility]  Date of Evaluation: 01/28/2022 Time spent:30 minutes  Chief Complaint:  Chief Complaint   Paranoid; Depression; Addiction Problem; Medication Problem; Medication Refill; Follow-up; Patient Education     HISTORY/CURRENT STATUS: HPI  Jonathan Carson", 67 year old male presents to this office for initial visit and to establish care.  He is alone this visit . He appears slightly agitated. A strong odor of cannabis about his person. He exhibits rapid speech and some restlessness often standing and starring out window. He is cooperative with me. He is still accusing wife of putting unknown medications in food.   Sometimes he is difficult to redirect. His thoughts continue to be mildly disorganized.  Even though his thought process is tangential at times, collateral information should be considered reliable as it does correlate with recent medical records reviewed. He says "I am having a lot of stress issues, says his wife is a "narcissistic bitch" Says he continues to have a lot of marital problems. He says that he is taking medication as prescribed but does not want wife "putting shit in my food".     Says that he does not like taking any medication but is willing to try.  He says that he understands that something is not right but that long-term stress is the cause for his psychological break.  He says that even though he loved his mother, that some stress of having to help her all the time has been alleviated.  He endorses use of Cannabis. Says he has been smoking since age 64. Says his depression is 4/10 and anxiety is 4/10. He says he is sleeping well with Seroquel but does not need it some nights to sleep.  He denies any auditory or visual hallucinations.  Has family history of bipolar and dementia. No SI or HI.  Says that he currently feels safe with no desire to harm himself.     Past  psychiatric medications: Patient denies     Individual Medical History/ Review of Systems: Changes? :No   Allergies: Patient has no known allergies.  Current Medications:  Current Outpatient Medications:    amLODipine-valsartan (EXFORGE) 5-160 MG tablet, Take 1 tablet by mouth daily., Disp: , Rfl:    ARIPiprazole (ABILIFY) 15 MG tablet, Take 1 tablet (15 mg total) by mouth daily., Disp: 30 tablet, Rfl: 1   QUEtiapine (SEROQUEL) 25 MG tablet, Take 1 tablet (25 mg total) by mouth at bedtime., Disp: 30 tablet, Rfl: 1 Medication Side Effects: none  Family Medical/ Social History: Changes? No  MENTAL HEALTH EXAM:  There were no vitals taken for this visit.There is no height or weight on file to calculate BMI.  General Appearance: Casual, Neat, and Well Groomed  Eye Contact:  Good  Speech:  Talkative  Volume:  Normal  Mood:  Anxious  Affect:  Appropriate  Thought Process:  Disorganized  Orientation:  Negative  Thought Content: Paranoid Ideation   Suicidal Thoughts:  No  Homicidal Thoughts:  No  Memory:  WNL  Judgement:  Impaired  Insight:  Lacking  Psychomotor Activity:  Increased  Concentration:  Concentration: Fair  Recall:  [employer] of Knowledge: Fair  Language: Fair  Assets:  Desire for Improvement Physical Health Resilience Social Support  ADL's:  Intact  Cognition: WNL  Prognosis:  Fair    DIAGNOSES: No diagnosis found.  Receiving Psychotherapy: No    RECOMMENDATIONS:  Greater than 50%  of  30  min face to face time with patient was spent on counseling and coordination of care. We discussed his currently level of stability. He feels like he is doing much better. He declares he knows when he needs to go to hospital. Obvious concerns about whether he is taking medication as prescribed and also concerned about heavy use of cannabis which could be complicating care.  He says he is not suicidal and not experiencing any auditory or visual hallucinations. He is very  paranoid still and does not report good relationship with spouse. I educated him on when it would be appropriate for him to go to ED or [place].  I do not feel at this point he is any threat of harm to himself or others. I do feel like family should be aware incase symptoms progress or he becomes more unstable. Discussed with him emergency resources available.  He acknowledges family hx of bipolar and report of previous hospitalization at age of 37 or 76.  Today we agreed to; To continue  Abilify to 15 mg to be taken at bedtime daily Will continue trazodone 50 mg at bedtime To start Seroquel 25 mg at bedtime Will report worsening symptoms or side effects Provided emergency contact information To follow-up in 8 weeks to reassess Discussed potential metabolic side effects associated with atypical antipsychotics, as well as potential risk for movement side effects. Advised pt to contact office if movement side effects occur.   Reviewed with him the risk of continuing Cannabis use while taking psychiatric medications or complicating treatment Reviewed PDMP     Elwanda Brooklyn, NP

## 2022-03-30 ENCOUNTER — Ambulatory Visit (INDEPENDENT_AMBULATORY_CARE_PROVIDER_SITE_OTHER): Payer: Medicare Other | Admitting: Behavioral Health

## 2022-03-30 ENCOUNTER — Encounter: Payer: Self-pay | Admitting: Behavioral Health

## 2022-03-30 DIAGNOSIS — F23 Brief psychotic disorder: Secondary | ICD-10-CM | POA: Diagnosis not present

## 2022-03-30 DIAGNOSIS — F5105 Insomnia due to other mental disorder: Secondary | ICD-10-CM | POA: Diagnosis not present

## 2022-03-30 DIAGNOSIS — F22 Delusional disorders: Secondary | ICD-10-CM

## 2022-03-30 DIAGNOSIS — F411 Generalized anxiety disorder: Secondary | ICD-10-CM

## 2022-03-30 DIAGNOSIS — F99 Mental disorder, not otherwise specified: Secondary | ICD-10-CM

## 2022-03-30 DIAGNOSIS — R4189 Other symptoms and signs involving cognitive functions and awareness: Secondary | ICD-10-CM

## 2022-03-30 MED ORDER — ESCITALOPRAM OXALATE 10 MG PO TABS: 10.0000 mg | ORAL_TABLET | Freq: Every day | ORAL | 1 refills | Status: DC

## 2022-03-30 MED ORDER — LORAZEPAM 0.5 MG PO TABS: 0.5000 mg | ORAL_TABLET | Freq: Two times a day (BID) | ORAL | 1 refills | Status: DC

## 2022-03-30 MED ORDER — ARIPIPRAZOLE 15 MG PO TABS: 15.0000 mg | ORAL_TABLET | Freq: Every day | ORAL | 2 refills | Status: DC

## 2022-03-30 MED ORDER — QUETIAPINE FUMARATE 25 MG PO TABS: ORAL_TABLET | ORAL | 1 refills | Status: DC

## 2022-03-30 NOTE — Progress Notes (Signed)
[facility] Med Check  Patient ID: Jonathan Carson,  MRN: [id number]  PCP: Pa, [place]  Date of Evaluation: 03/30/2022 Time spent:30 minutes  Chief Complaint:  Chief Complaint   Anxiety; Depression; Patient Education; Medication Problem; Medication Refill; Follow-up; Family Problem     HISTORY/CURRENT STATUS: HPI  Individual Medical History/ Review of Systems: Changes? :No   Allergies: Patient has no known allergies.  Current Medications:  Current Outpatient Medications:    escitalopram (LEXAPRO) 10 MG tablet, Take 1 tablet (10 mg total) by mouth daily., Disp: 30 tablet, Rfl: 1   LORazepam (ATIVAN) 0.5 MG tablet, Take 1 tablet (0.5 mg total) by mouth 2 (two) times daily., Disp: 60 tablet, Rfl: 1   amLODipine-valsartan (EXFORGE) 5-160 MG tablet, Take 1 tablet by mouth daily., Disp: , Rfl:    ARIPiprazole (ABILIFY) 15 MG tablet, Take 1 tablet (15 mg total) by mouth daily., Disp: 30 tablet, Rfl: 2   QUEtiapine (SEROQUEL) 25 MG tablet, Take one tablet 25 mg  by mouth at bedtime, and another 25 mg tablet in the am after breakfast., Disp: 60 tablet, Rfl: 1 Medication Side Effects: none  Family Medical/ Social History: Changes? No  MENTAL HEALTH EXAM:  There were no vitals taken for this visit.There is no height or weight on file to calculate BMI.  General Appearance: Casual and Neat  Eye Contact:  Fair  Speech:  Pressured  Volume:  Normal  Mood:  Anxious and Irritable  Affect:  Congruent and Anxious  Thought Process:  Coherent  Orientation:  Full (Time, Place, and Person)  Thought Content: Rumination   Suicidal Thoughts:  No  Homicidal Thoughts:  No  Memory:  WNL  Judgement:  Fair  Insight:  Fair  Psychomotor Activity:  Increased and Restlessness  Concentration:  Concentration: Fair  Recall:  [place] of Knowledge: Fair  Language: Good  Assets:  Desire for Improvement Physical Health Resilience Social Support  ADL's:  Intact   Cognition: WNL  Prognosis:  Good    DIAGNOSES:    ICD-10-CM   1. Generalized anxiety disorder  F41.1 QUEtiapine (SEROQUEL) 25 MG tablet    ARIPiprazole (ABILIFY) 15 MG tablet    escitalopram (LEXAPRO) 10 MG tablet    LORazepam (ATIVAN) 0.5 MG tablet    2. Insomnia due to other mental disorder  F51.05 QUEtiapine (SEROQUEL) 25 MG tablet   F99 LORazepam (ATIVAN) 0.5 MG tablet    3. Brief psychotic disorder (HCC)  F23 ARIPiprazole (ABILIFY) 15 MG tablet    4. Paranoia (psychosis) (HCC)  F22 ARIPiprazole (ABILIFY) 15 MG tablet    5. Disorganized thought process  R41.89 ARIPiprazole (ABILIFY) 15 MG tablet      Receiving Psychotherapy: No    RECOMMENDATIONS:    Greater than 50% of  30  min face to face time with patient was spent on counseling and coordination of care. We discussed his currently level of stability. Says that his anxiety has become worse again. His wife is present during interview this time an she concurs. Im still very concerned that his heavy cannabis use is complicating and making his anxiety worse. He constantly paces floor in office and cannot sit still.  He was very cooperative with spouse in her presence but expresses paranoia of non trust when he is alone.  I educated him on when it would be appropriate for him to go to ED or [place].  I do not feel at this point he is any threat of harm to  himself or others. Wife says he is less irritable and angry and is nicer that before. She feels like Abilify has helped with that but not the anxiety. I do feel like family should be aware incase symptoms progress or he becomes more unstable. Discussed with him emergency resources available.  He acknowledges family hx of bipolar and report of previous hospitalization at age of 15 or 24.  Today we agreed to; To continue  Abilify to 15 mg to be taken at bedtime daily Stop Trazodone  50 mg at bedtime To increase Seroquel 25 mg at bedtime and an additional dose in the morning of 25 mg  when awakening.  To start Ativan 0.5 mg twice daily as needed only for severe anxiety To start Lexapro 10 mg in the am after breakfast. Will report worsening symptoms or side effects Provided emergency contact information To follow-up in 4 weeks to reassess Discussed potential metabolic side effects associated with atypical antipsychotics, as well as potential risk for movement side effects. Advised pt to contact office if movement side effects occur.   Reviewed with him the risk of continuing Cannabis use while taking psychiatric medications or complicating treatment Reviewed PDMP     Elwanda Brooklyn, NP

## 2022-04-08 DIAGNOSIS — I10 Essential (primary) hypertension: Secondary | ICD-10-CM | POA: Diagnosis not present

## 2022-04-08 DIAGNOSIS — R002 Palpitations: Secondary | ICD-10-CM | POA: Diagnosis not present

## 2022-04-09 DIAGNOSIS — H5203 Hypermetropia, bilateral: Secondary | ICD-10-CM | POA: Diagnosis not present

## 2022-04-09 DIAGNOSIS — H0288B Meibomian gland dysfunction left eye, upper and lower eyelids: Secondary | ICD-10-CM | POA: Diagnosis not present

## 2022-04-09 DIAGNOSIS — H0288A Meibomian gland dysfunction right eye, upper and lower eyelids: Secondary | ICD-10-CM | POA: Diagnosis not present

## 2022-04-09 DIAGNOSIS — H1045 Other chronic allergic conjunctivitis: Secondary | ICD-10-CM | POA: Diagnosis not present

## 2022-04-09 DIAGNOSIS — H2513 Age-related nuclear cataract, bilateral: Secondary | ICD-10-CM | POA: Diagnosis not present

## 2022-04-27 ENCOUNTER — Ambulatory Visit: Payer: Medicare Other | Admitting: Behavioral Health

## 2022-04-27 ENCOUNTER — Other Ambulatory Visit: Payer: Self-pay | Admitting: Behavioral Health

## 2022-04-27 DIAGNOSIS — F23 Brief psychotic disorder: Secondary | ICD-10-CM

## 2022-04-27 DIAGNOSIS — F411 Generalized anxiety disorder: Secondary | ICD-10-CM

## 2022-04-27 DIAGNOSIS — F22 Delusional disorders: Secondary | ICD-10-CM

## 2022-04-27 DIAGNOSIS — R4189 Other symptoms and signs involving cognitive functions and awareness: Secondary | ICD-10-CM

## 2022-05-05 DIAGNOSIS — S299XXA Unspecified injury of thorax, initial encounter: Secondary | ICD-10-CM | POA: Diagnosis not present

## 2022-06-23 DIAGNOSIS — Z0000 Encounter for general adult medical examination without abnormal findings: Secondary | ICD-10-CM | POA: Diagnosis not present

## 2022-06-23 DIAGNOSIS — Z23 Encounter for immunization: Secondary | ICD-10-CM | POA: Diagnosis not present

## 2022-06-23 DIAGNOSIS — R413 Other amnesia: Secondary | ICD-10-CM | POA: Diagnosis not present

## 2022-06-23 DIAGNOSIS — I10 Essential (primary) hypertension: Secondary | ICD-10-CM | POA: Diagnosis not present

## 2022-06-23 DIAGNOSIS — E782 Mixed hyperlipidemia: Secondary | ICD-10-CM | POA: Diagnosis not present

## 2022-06-23 DIAGNOSIS — F17210 Nicotine dependence, cigarettes, uncomplicated: Secondary | ICD-10-CM | POA: Diagnosis not present

## 2022-06-23 DIAGNOSIS — Z1389 Encounter for screening for other disorder: Secondary | ICD-10-CM | POA: Diagnosis not present

## 2022-07-01 ENCOUNTER — Encounter: Payer: Self-pay | Admitting: Behavioral Health

## 2022-07-01 ENCOUNTER — Ambulatory Visit: Payer: Medicare Other | Admitting: Behavioral Health

## 2022-07-01 DIAGNOSIS — F411 Generalized anxiety disorder: Secondary | ICD-10-CM

## 2022-07-01 DIAGNOSIS — F22 Delusional disorders: Secondary | ICD-10-CM | POA: Diagnosis not present

## 2022-07-01 DIAGNOSIS — R4189 Other symptoms and signs involving cognitive functions and awareness: Secondary | ICD-10-CM

## 2022-07-01 DIAGNOSIS — F99 Mental disorder, not otherwise specified: Secondary | ICD-10-CM

## 2022-07-01 DIAGNOSIS — F5105 Insomnia due to other mental disorder: Secondary | ICD-10-CM | POA: Diagnosis not present

## 2022-07-01 MED ORDER — QUETIAPINE FUMARATE 25 MG PO TABS: ORAL_TABLET | ORAL | 1 refills | Status: DC

## 2022-07-01 MED ORDER — LORAZEPAM 0.5 MG PO TABS: 0.5000 mg | ORAL_TABLET | Freq: Two times a day (BID) | ORAL | 1 refills | Status: DC

## 2022-07-01 MED ORDER — ARIPIPRAZOLE 15 MG PO TABS: ORAL_TABLET | ORAL | 2 refills | Status: DC

## 2022-07-01 NOTE — Progress Notes (Signed)
[facility] Check  Patient ID: Jonathan Carson,  MRN: [id number]  PCP: Georgann Housekeeper, MD  Date of Evaluation: 07/01/2022 Time spent:30 minutes  Chief Complaint:  Chief Complaint   Anxiety; Depression; Follow-up; Patient Education; Medication Refill; Paranoid     HISTORY/CURRENT STATUS: HPI "Jonathan Carson", 67 year old male presents to this office for follow up and medication management.  He is alone this visit . He is calm and collected. Appears to have some restlessness but much less than previous visit. Say he is taking medication as prescribed except for Lexapro which he stopped. Says he did not like the way it made him feel. Says his depression is 3/10 and anxiety is 2/10. He says he is sleeping well with Seroquel but does not need it some nights to sleep.  He denies any auditory or visual hallucinations.  Has family history of bipolar and dementia. No SI or HI.  Says that he currently feels safe with no desire to harm himself.     Past psychiatric medications: Patient denies Individual Medical History/ Review of Systems: Changes? :No   Allergies: Patient has no known allergies.  Current Medications:  Current Outpatient Medications:    amLODipine-valsartan (EXFORGE) 5-160 MG tablet, Take 1 tablet by mouth daily., Disp: , Rfl:    ARIPiprazole (ABILIFY) 15 MG tablet, TAKE 1 TABLET(15 MG) BY MOUTH DAILY., Disp: 30 tablet, Rfl: 2   LORazepam (ATIVAN) 0.5 MG tablet, Take 1 tablet (0.5 mg total) by mouth 2 (two) times daily., Disp: 60 tablet, Rfl: 1   QUEtiapine (SEROQUEL) 25 MG tablet, Take one tablet 25 mg  by mouth at bedtime, and another 25 mg tablet in the am after breakfast., Disp: 60 tablet, Rfl: 1 Medication Side Effects: none  Family Medical/ Social History: Changes? No  MENTAL HEALTH EXAM:  There were no vitals taken for this visit.There is no height or weight on file to calculate BMI.  General Appearance: Casual, Neat, and Well Groomed  Eye Contact:  Good  Speech:   Clear and Coherent  Volume:  Normal  Mood:  Anxious  Affect:  Appropriate and Constricted  Thought Process:  Coherent  Orientation:  Full (Time, Place, and Person)  Thought Content: Logical   Suicidal Thoughts:  No  Homicidal Thoughts:  No  Memory:  WNL  Judgement:  Good  Insight:  Good  Psychomotor Activity:  Normal  Concentration:  Concentration: Good  Recall:  Good  Fund of Knowledge: Good  Language: Good  Assets:  Desire for Improvement  ADL's:  Intact  Cognition: WNL  Prognosis:  Good    DIAGNOSES:    ICD-10-CM   1. Generalized anxiety disorder  F41.1 ARIPiprazole (ABILIFY) 15 MG tablet    LORazepam (ATIVAN) 0.5 MG tablet    QUEtiapine (SEROQUEL) 25 MG tablet    2. Paranoia (psychosis) (HCC)  F22 ARIPiprazole (ABILIFY) 15 MG tablet    3. Disorganized thought process  R41.89 ARIPiprazole (ABILIFY) 15 MG tablet    4. Insomnia due to other mental disorder  F51.05 LORazepam (ATIVAN) 0.5 MG tablet   F99 QUEtiapine (SEROQUEL) 25 MG tablet      Receiving Psychotherapy: No    RECOMMENDATIONS:  Greater than 50% of  30  min face to face time with patient was spent on counseling and coordination of care. We discussed his currently level of stability. Says he is much more calm and had no recently problems. No ER visit. He is not wanting to adjust or change medications this visit.  Wife helps organize  medication.  Today we agreed to; To continue  Abilify to 15 mg to be taken at bedtime daily To continue Seroquel 25 mg at bedtime and an additional dose in the morning of 25 mg when awakening.  To continue Ativan 0.5 mg twice daily as needed only for severe anxiety Pt stopped his Lexapro Will report worsening symptoms or side effects Provided emergency contact information To follow-up in 3 months to reassess Discussed potential metabolic side effects associated with atypical antipsychotics, as well as potential risk for movement side effects. Advised pt to contact office if  movement side effects occur.   Reviewed with him the risk of continuing Cannabis use while taking psychiatric medications or complicating treatment Reviewed PDMP       Joan Flores, NP

## 2022-09-15 ENCOUNTER — Encounter: Payer: Self-pay | Admitting: Neurology

## 2022-09-21 ENCOUNTER — Encounter: Payer: Self-pay | Admitting: Neurology

## 2022-09-21 ENCOUNTER — Ambulatory Visit: Payer: Medicare Other | Admitting: Neurology

## 2022-09-21 VITALS — BP 110/64 | Ht 70.0 in | Wt 174.0 lb

## 2022-09-21 DIAGNOSIS — F419 Anxiety disorder, unspecified: Secondary | ICD-10-CM | POA: Diagnosis not present

## 2022-09-21 DIAGNOSIS — R4189 Other symptoms and signs involving cognitive functions and awareness: Secondary | ICD-10-CM | POA: Diagnosis not present

## 2022-09-21 DIAGNOSIS — F319 Bipolar disorder, unspecified: Secondary | ICD-10-CM

## 2022-09-21 DIAGNOSIS — F32A Depression, unspecified: Secondary | ICD-10-CM

## 2022-09-21 NOTE — Progress Notes (Unsigned)
[facility]  PATIENT: Jonathan Carson DOB: October 29, 1955  REQUESTING CLINICIAN: Georgann Housekeeper, MD HISTORY FROM: Patient,  spouse and chart review  REASON FOR VISIT: Memory loss    HISTORICAL  CHIEF COMPLAINT:  Chief Complaint  Patient presents with   New Patient (Initial Visit)    Rm 13, NP memory, cognitive changes    HISTORY OF PRESENT ILLNESS:  This is a 67 year old gentleman past medical history of hypertension, hyperlipidemia, anxiety/depression, bipolar disorder, recent mental health admission around Thanksgiving for psychosis who is presenting at the request of his psychiatrist for evaluation of memory.  Wife reports that last year patient was using marijuana heavily, and had a mental breakdown around Thanksgiving and was admitted to the mental health hospital.  Right before the admission he was very forgetful, making mistakes, putting things that belong in the refrigerator into a cabinet, He was also very paranoid, believing that wife wants to poison him. He needed reminders about self-care, was not heating, lost about lbs 100. After discharge from the mental institution he was put on antipsychotic including Abilify, Seroquel, Lexapro.  He was compliant with his medications except the Lexapro which he said did not like how it made him feel.  Wife reports since he has been on the medication and cutting down on the marijuana, his memory seems improved, overall he is getting better he is still forgetful but improving every day, he has regained his weight.  There is a reported family history of dementia in his mother.   TBI:   No past history of TBI Stroke:   no past history of stroke Seizures:   no past history of seizures Sleep:   no history of sleep apnea.   Mood:  Yes, has anxiety, depression, and bipolar disorder, under the care of psychiatry  Family history of Dementia:   Mother with Alzheimer dementia   Functional status: independent in all ADLs and  IADLs Patient lives with spouse . Cooking: some Cleaning: some Shopping: some  Bathing: Needed reminder but improving  Toileting: needed reminder but improving  Driving: no Bills: spouse  Ever left the stove on by accident?: denies, but put butter in the cabinet, again improving  Forget how to use items around the house?: denies  Getting lost going to familiar places?: denies  Forgetting loved ones names?: denies  Word finding difficulty? Sometimes  Sleep: Better with Seroquel    OTHER MEDICAL CONDITIONS: Anxiety, Depression, Bipolar disorder, Heavy marijuana use in remission    REVIEW OF SYSTEMS: Full 14 system review of systems performed and negative with exception of: As noted in the HPI    ALLERGIES: No Known Allergies  HOME MEDICATIONS: Outpatient Medications Prior to Visit  Medication Sig Dispense Refill   amLODipine-valsartan (EXFORGE) 5-160 MG tablet Take 1 tablet by mouth daily.     ARIPiprazole (ABILIFY) 15 MG tablet TAKE 1 TABLET(15 MG) BY MOUTH DAILY. 30 tablet 2   LORazepam (ATIVAN) 0.5 MG tablet Take 1 tablet (0.5 mg total) by mouth 2 (two) times daily. 60 tablet 1   QUEtiapine (SEROQUEL) 25 MG tablet Take one tablet 25 mg  by mouth at bedtime, and another 25 mg tablet in the am after breakfast. 60 tablet 1   No facility-administered medications prior to visit.    PAST MEDICAL HISTORY: Past Medical History:  Diagnosis Date   BPH (benign prostatic hyperplasia)    Chest pain    HTN (hypertension)    Hyperlipidemia    Right hydrocele  PAST SURGICAL HISTORY: Past Surgical History:  Procedure Laterality Date   COLONOSCOPY  2021   COLONOSCOPY  2023   RHINOPLASTY  1980    FAMILY HISTORY: Family History  Problem Relation Age of Onset   Dementia Mother     SOCIAL HISTORY: Social History   Socioeconomic History   Marital status: Married    Spouse name: Cordelia Pen   Number of children: 1   Years of education: 12   Highest education level: GED or  equivalent  Occupational History   Occupation: Retired Geographical information systems officer  Tobacco Use   Smoking status: Every Day    Current packs/day: 0.50    Average packs/day: 0.5 packs/day for 45.0 years (22.5 ttl pk-yrs)    Types: Cigarettes   Smokeless tobacco: Never  Substance and Sexual Activity   Alcohol use: Not Currently   Drug use: Not Currently    Types: Marijuana   Sexual activity: Not Currently  Other Topics Concern   Not on file  Social History Narrative   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   Social Determinants of Health   Financial Resource Strain: Not on file  Food Insecurity: Not on file  Transportation Needs: Not on file  Physical Activity: Not on file  Stress: Not on file  Social Connections: Not on file  Intimate Partner Violence: Not on file    PHYSICAL EXAM  GENERAL EXAM/CONSTITUTIONAL: Vitals:  Vitals:   09/21/22 1457  BP: 110/64  Weight: 174 lb (78.9 kg)  Height: 5\' 10"  (1.778 m)   Body mass index is 24.97 kg/m. Wt Readings from Last 3 Encounters:  09/21/22 174 lb (78.9 kg)  12/29/21 161 lb (73 kg)  12/16/21 148 lb (67.1 kg)   Patient is in no distress; well developed, nourished and groomed; neck is supple  MUSCULOSKELETAL: Gait, strength, tone, movements noted in Neurologic exam below  NEUROLOGIC: MENTAL STATUS:      No data to display            09/21/2022    2:58 PM  Montreal Cognitive Assessment   Visuospatial/ Executive (0/5) 3  Naming (0/3) 3  Attention: Read list of digits (0/2) 2  Attention: Read list of letters (0/1) 1  Attention: Serial 7 subtraction starting at 100 (0/3) 3  Language: Repeat phrase (0/2) 1  Language : Fluency (0/1) 0  Abstraction (0/2) 2  Delayed Recall (0/5) 1  Orientation (0/6) 5  Total 21    CRANIAL NERVE:  2nd, 3rd, 4th, 6th- visual fields full to confrontation, extraocular muscles intact, no nystagmus 5th - facial sensation  symmetric 7th - facial strength symmetric 8th - hearing intact 9th - palate elevates symmetrically, uvula midline 11th - shoulder shrug symmetric 12th - tongue protrusion midline  MOTOR:  normal bulk and tone, full strength in the BUE, BLE  SENSORY:  normal and symmetric to light touch  COORDINATION:  finger-nose-finger, fine finger movements normal  GAIT/STATION:  normal   DIAGNOSTIC DATA (LABS, IMAGING, TESTING) - I reviewed patient records, labs, notes, testing and imaging myself where available.  Lab Results  Component Value Date   WBC 7.9 12/18/2021   HGB 17.4 (H) 12/18/2021   HCT 50.6 12/18/2021   MCV 93.2 12/18/2021   PLT 244 12/18/2021      Component Value Date/Time   NA 140 12/18/2021 1629   K 3.7 12/18/2021 1629   CL 102 12/18/2021 1629   CO2 27 12/18/2021  1629   GLUCOSE 135 (H) 12/18/2021 1629   BUN 22 12/18/2021 1629   CREATININE 1.35 (H) 12/18/2021 1629   CALCIUM 10.3 12/18/2021 1629   PROT 7.1 12/18/2021 1629   ALBUMIN 4.3 12/18/2021 1629   AST 31 12/18/2021 1629   ALT 25 12/18/2021 1629   ALKPHOS 87 12/18/2021 1629   BILITOT 1.0 12/18/2021 1629   GFRNONAA 58 (L) 12/18/2021 1629   GFRAA  05/22/2010 1217    >60        The eGFR has been calculated using the MDRD equation. This calculation has not been validated in all clinical situations. eGFR's persistently <60 mL/min signify possible Chronic Kidney Disease.   No results found for: "CHOL", "HDL", "LDLCALC", "LDLDIRECT", "TRIG", "CHOLHDL" No results found for: "HGBA1C" No results found for: "VITAMINB12" No results found for: "TSH"  Head CT 12/18/2021 No acute intracranial pathology.    ASSESSMENT AND PLAN  67 y.o. year old male with history of hypertension, hyperlipidemia, bipolar disorder, anxiety/depression who is presenting with memory loss. It seems like the memory problems started last year around his mental breakdown when patient lost a lot of weight and believed that his wife  was trying to poison him. Since being put on medications, he continued to improve, sleeping well and eating well. He has regained some weight.  Today on exam he scored 21 out of 30 on the MoCA indicative of impairment but I do not suspect patient presentation to be secondary to underlying dementia.  I think this is all part of his mental illness.  Both patient and wife report improvement since patient is being compliant with his medications.  My plan is to bring him back in 6 months hopefully he will stay compliant with his medications to repeat the MoCA and hopefully we will see some improvement.  Continue to follow with PCP, continue follow-up with psychiatry and and return in 6 months.   1. Cognitive impairment   2. Depression, unspecified depression type   3. Anxiety   4. Bipolar affective disorder, remission status unspecified (HCC)      Patient Instructions  Continue current medications Continue to follow-up with mental health Return in 35-month for repeat Moca exam  No orders of the defined types were placed in this encounter.   No orders of the defined types were placed in this encounter.   Return in about 6 months (around 03/24/2023) for Repeat MoCa Exam .  I have spent a total of 60 minutes dedicated to this patient today, preparing to see patient, performing a medically appropriate examination and evaluation, ordering tests and/or medications and procedures, and counseling and educating the patient/family/caregiver; independently interpreting result and communicating results to the family/patient/caregiver; and documenting clinical information in the electronic medical record.   Windell Norfolk, MD 09/22/2022, 9:43 AM  [facility] [address] [place], [place] [address] [phone]

## 2022-09-22 ENCOUNTER — Encounter: Payer: Self-pay | Admitting: Neurology

## 2022-09-22 NOTE — Patient Instructions (Signed)
Continue current medications Continue to follow-up with mental health Return in 31-month for repeat Moca exam

## 2022-10-01 ENCOUNTER — Ambulatory Visit: Payer: Medicare Other | Admitting: Behavioral Health

## 2022-11-02 ENCOUNTER — Other Ambulatory Visit: Payer: Self-pay | Admitting: Behavioral Health

## 2022-11-02 DIAGNOSIS — F411 Generalized anxiety disorder: Secondary | ICD-10-CM

## 2022-11-02 DIAGNOSIS — R4189 Other symptoms and signs involving cognitive functions and awareness: Secondary | ICD-10-CM

## 2022-11-02 DIAGNOSIS — F22 Delusional disorders: Secondary | ICD-10-CM

## 2023-03-29 ENCOUNTER — Telehealth: Payer: Self-pay | Admitting: Anesthesiology

## 2023-03-29 ENCOUNTER — Ambulatory Visit: Payer: Medicare Other | Admitting: Neurology

## 2023-03-29 ENCOUNTER — Encounter: Payer: Self-pay | Admitting: Neurology

## 2023-03-29 VITALS — BP 131/90 | HR 86 | Ht 70.0 in | Wt 200.0 lb

## 2023-03-29 DIAGNOSIS — R251 Tremor, unspecified: Secondary | ICD-10-CM | POA: Diagnosis not present

## 2023-03-29 DIAGNOSIS — R419 Unspecified symptoms and signs involving cognitive functions and awareness: Secondary | ICD-10-CM | POA: Diagnosis not present

## 2023-03-29 NOTE — Progress Notes (Signed)
 [facility]  PATIENT: Jonathan Carson DOB: 1955-04-19  REQUESTING CLINICIAN: Georgann Housekeeper, MD HISTORY FROM: Patient,  spouse and chart review  REASON FOR VISIT: Memory loss    HISTORICAL  CHIEF COMPLAINT:  Chief Complaint  Patient presents with   Follow-up    Pt in 12, here with wife Cordelia Pen  Pt is following up on cognitive impairment. Pt states he feels his memory is doing well.     INTERVAL HISTORY 03/29/2023:  Patient presents today for follow-up, he is accompanied by wife.  Last visit was 6 months ago.  Since then, he tells me that he continues to improve, he is doing very well.  He did follow-up with his new psychiatrist had some medication changes, currently only taking Abilify.  Wife tells me that his memory is back to baseline, he is back to work, working for the city but she is concerned of a new tremor.  She tells me that patient tremor has been going on for many years but lately got worse.  Tremor is mainly on the right hand, and only with movement.  She tells me that his grandmother had Parkinson disease but patient mother had the same type of tremor, action tremor.   HISTORY OF PRESENT ILLNESS:  This is a 68 year old gentleman past medical history of hypertension, hyperlipidemia, anxiety/depression, bipolar disorder, recent mental health admission around Thanksgiving for psychosis who is presenting at the request of his psychiatrist for evaluation of memory.  Wife reports that last year patient was using marijuana heavily, and had a mental breakdown around Thanksgiving and was admitted to the mental health hospital.  Right before the admission he was very forgetful, making mistakes, putting things that belong in the refrigerator into a cabinet, He was also very paranoid, believing that wife wants to poison him. He needed reminders about self-care, was not heating, lost about lbs 100. After discharge from the mental institution he was put on antipsychotic  including Abilify, Seroquel, Lexapro.  He was compliant with his medications except the Lexapro which he said did not like how it made him feel.  Wife reports since he has been on the medication and cutting down on the marijuana, his memory seems improved, overall he is getting better he is still forgetful but improving every day, he has regained his weight.  There is a reported family history of dementia in his mother.   TBI:   No past history of TBI Stroke:   no past history of stroke Seizures:   no past history of seizures Sleep:   no history of sleep apnea.   Mood:  Yes, has anxiety, depression, and bipolar disorder, under the care of psychiatry  Family history of Dementia:   Mother with Alzheimer dementia   Functional status: independent in all ADLs and IADLs Patient lives with spouse . Cooking: some Cleaning: some Shopping: some  Bathing: Needed reminder but improving  Toileting: needed reminder but improving  Driving: no Bills: spouse  Ever left the stove on by accident?: denies, but put butter in the cabinet, again improving  Forget how to use items around the house?: denies  Getting lost going to familiar places?: denies  Forgetting loved ones names?: denies  Word finding difficulty? Sometimes  Sleep: Better with Seroquel    OTHER MEDICAL CONDITIONS: Anxiety, Depression, Bipolar disorder, Heavy marijuana use in remission    REVIEW OF SYSTEMS: Full 14 system review of systems performed and negative with exception of: As noted in the HPI  ALLERGIES: No Known Allergies  HOME MEDICATIONS: Outpatient Medications Prior to Visit  Medication Sig Dispense Refill   ARIPiprazole (ABILIFY) 10 MG tablet Take 10 mg by mouth daily.     amLODipine-valsartan (EXFORGE) 5-160 MG tablet Take 1 tablet by mouth daily.     ARIPiprazole (ABILIFY) 15 MG tablet TAKE 1 TABLET(15 MG) BY MOUTH DAILY 30 tablet 0   LORazepam (ATIVAN) 0.5 MG tablet Take 1 tablet (0.5 mg total) by mouth 2 (two)  times daily. 60 tablet 1   QUEtiapine (SEROQUEL) 25 MG tablet Take one tablet 25 mg  by mouth at bedtime, and another 25 mg tablet in the am after breakfast. 60 tablet 1   No facility-administered medications prior to visit.    PAST MEDICAL HISTORY: Past Medical History:  Diagnosis Date   BPH (benign prostatic hyperplasia)    Chest pain    HTN (hypertension)    Hyperlipidemia    Right hydrocele     PAST SURGICAL HISTORY: Past Surgical History:  Procedure Laterality Date   COLONOSCOPY  2021   COLONOSCOPY  2023   RHINOPLASTY  1980    FAMILY HISTORY: Family History  Problem Relation Age of Onset   Dementia Mother     SOCIAL HISTORY: Social History   Socioeconomic History   Marital status: Married    Spouse name: Cordelia Pen   Number of children: 1   Years of education: 12   Highest education level: GED or equivalent  Occupational History   Occupation: Retired Geographical information systems officer  Tobacco Use   Smoking status: Every Day    Current packs/day: 0.50    Average packs/day: 0.5 packs/day for 45.0 years (22.5 ttl pk-yrs)    Types: Cigarettes   Smokeless tobacco: Never   Tobacco comments:    Pt states he smokes 1/2 pack a day   Substance and Sexual Activity   Alcohol use: Not Currently   Drug use: Not Currently    Types: Marijuana   Sexual activity: Not Currently  Other Topics Concern   Not on file  Social History Narrative   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   Social Drivers of Corporate investment banker Strain: Not on file  Food Insecurity: Not on file  Transportation Needs: Not on file  Physical Activity: Not on file  Stress: Not on file  Social Connections: Not on file  Intimate Partner Violence: Not on file    PHYSICAL EXAM  GENERAL EXAM/CONSTITUTIONAL: Vitals:  Vitals:   03/29/23 1433  BP: (!) 131/90  Pulse: 86  Weight: 200 lb (90.7 kg)  Height: 5\' 10"  (1.778 m)   Body mass index is  28.7 kg/m. Wt Readings from Last 3 Encounters:  03/29/23 200 lb (90.7 kg)  09/21/22 174 lb (78.9 kg)  12/16/21 148 lb (67.1 kg)   Patient is in no distress; well developed, nourished and groomed; neck is supple  MUSCULOSKELETAL: Gait, strength, tone, movements noted in Neurologic exam below  NEUROLOGIC: MENTAL STATUS:      No data to display            03/29/2023    2:38 PM 09/21/2022    2:58 PM  Montreal Cognitive Assessment   Visuospatial/ Executive (0/5) 5 3  Naming (0/3) 3 3  Attention: Read list of digits (0/2) 2 2  Attention: Read list of letters (0/1) 1 1  Attention: Serial 7 subtraction starting at 100 (0/3) 3 3  Language: Repeat phrase (0/2) 2 1  Language : Fluency (0/1) 1 0  Abstraction (0/2) 2 2  Delayed Recall (0/5) 3 1  Orientation (0/6) 6 5  Total 28 21    CRANIAL NERVE:  2nd, 3rd, 4th, 6th- visual fields full to confrontation, extraocular muscles intact, no nystagmus 5th - facial sensation symmetric 7th - facial strength symmetric 8th - hearing intact 9th - palate elevates symmetrically, uvula midline 11th - shoulder shrug symmetric 12th - tongue protrusion midline  MOTOR:  normal bulk and tone, full strength in the BUE, BLE  SENSORY:  normal and symmetric to light touch  COORDINATION:  finger-nose-finger, fine finger movements normal  GAIT/STATION:  normal   DIAGNOSTIC DATA (LABS, IMAGING, TESTING) - I reviewed patient records, labs, notes, testing and imaging myself where available.  Lab Results  Component Value Date   WBC 7.9 12/18/2021   HGB 17.4 (H) 12/18/2021   HCT 50.6 12/18/2021   MCV 93.2 12/18/2021   PLT 244 12/18/2021      Component Value Date/Time   NA 140 12/18/2021 1629   K 3.7 12/18/2021 1629   CL 102 12/18/2021 1629   CO2 27 12/18/2021 1629   GLUCOSE 135 (H) 12/18/2021 1629   BUN 22 12/18/2021 1629   CREATININE 1.35 (H) 12/18/2021 1629   CALCIUM 10.3 12/18/2021 1629   PROT 7.1 12/18/2021 1629   ALBUMIN 4.3  12/18/2021 1629   AST 31 12/18/2021 1629   ALT 25 12/18/2021 1629   ALKPHOS 87 12/18/2021 1629   BILITOT 1.0 12/18/2021 1629   GFRNONAA 58 (L) 12/18/2021 1629   GFRAA  05/22/2010 1217    >60        The eGFR has been calculated using the MDRD equation. This calculation has not been validated in all clinical situations. eGFR's persistently <60 mL/min signify possible Chronic Kidney Disease.   No results found for: "CHOL", "HDL", "LDLCALC", "LDLDIRECT", "TRIG", "CHOLHDL" No results found for: "HGBA1C" No results found for: "VITAMINB12" No results found for: "TSH"  Head CT 12/18/2021 No acute intracranial pathology.    ASSESSMENT AND PLAN  68 y.o. year old male with history of hypertension, hyperlipidemia, bipolar disorder, anxiety/depression who is presenting for follow up for his memory loss.  Since last visit he has continued to do well, he no longer taking all the medications, only on Abilify.  He is back to working again.  Today on exam he has a normal MoCA indicative of normal cognition. In terms of his tremor, per description, it sounds like action tremor but I was not able to elicit it on exam.  I have advised him to continue monitoring the symptoms and to call us if it gets worse.  Continue to follow with PCP and return as needed.   1. Cognitive complaints with normal exam   2. Tremor     Patient Instructions  Continue current medications Continue follow-up with your doctors Please contact your primary to get worse Return as needed.  No orders of the defined types were placed in this encounter.   No orders of the defined types were placed in this encounter.   Return if symptoms worsen or fail to improve.     Windell Norfolk, MD 03/29/2023, 3:17 PM  [facility] [address] [place], [place] [address] [phone]

## 2023-03-29 NOTE — Patient Instructions (Addendum)
 Continue current medications Continue follow-up with your doctors Please contact your primary to get worse Return as needed.

## 2023-03-30 ENCOUNTER — Telehealth: Payer: Self-pay | Admitting: Anesthesiology

## 2023-03-30 NOTE — Telephone Encounter (Signed)
Error

## 2023-04-12 DIAGNOSIS — H43813 Vitreous degeneration, bilateral: Secondary | ICD-10-CM | POA: Diagnosis not present

## 2023-04-12 DIAGNOSIS — H2513 Age-related nuclear cataract, bilateral: Secondary | ICD-10-CM | POA: Diagnosis not present

## 2023-05-11 DIAGNOSIS — F6381 Intermittent explosive disorder: Secondary | ICD-10-CM | POA: Diagnosis not present

## 2023-05-11 DIAGNOSIS — F1290 Cannabis use, unspecified, uncomplicated: Secondary | ICD-10-CM | POA: Diagnosis not present

## 2023-05-11 DIAGNOSIS — F259 Schizoaffective disorder, unspecified: Secondary | ICD-10-CM | POA: Diagnosis not present

## 2023-06-30 DIAGNOSIS — Z1331 Encounter for screening for depression: Secondary | ICD-10-CM | POA: Diagnosis not present

## 2023-06-30 DIAGNOSIS — Z23 Encounter for immunization: Secondary | ICD-10-CM | POA: Diagnosis not present

## 2023-06-30 DIAGNOSIS — Z0000 Encounter for general adult medical examination without abnormal findings: Secondary | ICD-10-CM | POA: Diagnosis not present

## 2023-06-30 DIAGNOSIS — E782 Mixed hyperlipidemia: Secondary | ICD-10-CM | POA: Diagnosis not present

## 2023-06-30 DIAGNOSIS — I10 Essential (primary) hypertension: Secondary | ICD-10-CM | POA: Diagnosis not present

## 2023-06-30 DIAGNOSIS — F3181 Bipolar II disorder: Secondary | ICD-10-CM | POA: Diagnosis not present

## 2023-06-30 DIAGNOSIS — F17210 Nicotine dependence, cigarettes, uncomplicated: Secondary | ICD-10-CM | POA: Diagnosis not present

## 2023-06-30 DIAGNOSIS — N400 Enlarged prostate without lower urinary tract symptoms: Secondary | ICD-10-CM | POA: Diagnosis not present

## 2023-09-07 DIAGNOSIS — F6381 Intermittent explosive disorder: Secondary | ICD-10-CM | POA: Diagnosis not present

## 2023-09-07 DIAGNOSIS — F259 Schizoaffective disorder, unspecified: Secondary | ICD-10-CM | POA: Diagnosis not present

## 2023-12-29 DIAGNOSIS — F17210 Nicotine dependence, cigarettes, uncomplicated: Secondary | ICD-10-CM | POA: Diagnosis not present

## 2023-12-29 DIAGNOSIS — F3181 Bipolar II disorder: Secondary | ICD-10-CM | POA: Diagnosis not present

## 2023-12-29 DIAGNOSIS — I10 Essential (primary) hypertension: Secondary | ICD-10-CM | POA: Diagnosis not present

## 2024-01-03 DIAGNOSIS — F6381 Intermittent explosive disorder: Secondary | ICD-10-CM | POA: Diagnosis not present

## 2024-01-03 DIAGNOSIS — F259 Schizoaffective disorder, unspecified: Secondary | ICD-10-CM | POA: Diagnosis not present

## 2024-01-03 DIAGNOSIS — F1290 Cannabis use, unspecified, uncomplicated: Secondary | ICD-10-CM | POA: Diagnosis not present
